# Patient Record
Sex: Female | Born: 1940 | Race: White | Hispanic: No | Smoking: Former smoker
Health system: Southern US, Community
[De-identification: ages and names within clinical notes are randomized; demographics above are authoritative.]

## PROBLEM LIST (undated history)

## (undated) DIAGNOSIS — E785 Hyperlipidemia, unspecified: Secondary | ICD-10-CM

## (undated) DIAGNOSIS — K769 Liver disease, unspecified: Secondary | ICD-10-CM

## (undated) DIAGNOSIS — I2510 Atherosclerotic heart disease of native coronary artery without angina pectoris: Secondary | ICD-10-CM

## (undated) DIAGNOSIS — I5030 Unspecified diastolic (congestive) heart failure: Secondary | ICD-10-CM

## (undated) DIAGNOSIS — G4700 Insomnia, unspecified: Secondary | ICD-10-CM

## (undated) DIAGNOSIS — E119 Type 2 diabetes mellitus without complications: Secondary | ICD-10-CM

## (undated) DIAGNOSIS — I10 Essential (primary) hypertension: Secondary | ICD-10-CM

## (undated) DIAGNOSIS — F410 Panic disorder [episodic paroxysmal anxiety] without agoraphobia: Secondary | ICD-10-CM

## (undated) DIAGNOSIS — K219 Gastro-esophageal reflux disease without esophagitis: Secondary | ICD-10-CM

## (undated) HISTORY — DX: Gastro-esophageal reflux disease without esophagitis: K21.9

## (undated) HISTORY — PX: ABDOMINAL HYSTERECTOMY: SHX81

## (undated) HISTORY — DX: Hyperlipidemia, unspecified: E78.5

## (undated) HISTORY — PX: CYSTECTOMY: SUR359

## (undated) HISTORY — PX: APPENDECTOMY: SHX54

## (undated) HISTORY — DX: Panic disorder (episodic paroxysmal anxiety): F41.0

## (undated) HISTORY — DX: Liver disease, unspecified: K76.9

## (undated) HISTORY — DX: Insomnia, unspecified: G47.00

## (undated) HISTORY — DX: Atherosclerotic heart disease of native coronary artery without angina pectoris: I25.10

---

## 2004-09-11 ENCOUNTER — Emergency Department: Payer: Self-pay | Admitting: Internal Medicine

## 2004-09-11 ENCOUNTER — Other Ambulatory Visit: Payer: Self-pay

## 2004-09-15 ENCOUNTER — Ambulatory Visit: Payer: Self-pay | Admitting: Family Medicine

## 2004-09-24 ENCOUNTER — Ambulatory Visit: Payer: Self-pay | Admitting: Family Medicine

## 2005-03-09 ENCOUNTER — Ambulatory Visit: Payer: Self-pay | Admitting: Family Medicine

## 2006-12-11 ENCOUNTER — Inpatient Hospital Stay: Payer: Self-pay | Admitting: Cardiology

## 2006-12-11 ENCOUNTER — Other Ambulatory Visit: Payer: Self-pay

## 2010-05-27 ENCOUNTER — Ambulatory Visit: Payer: Self-pay

## 2010-05-28 ENCOUNTER — Ambulatory Visit: Payer: Self-pay | Admitting: Cardiology

## 2010-05-30 ENCOUNTER — Emergency Department: Payer: Self-pay | Admitting: Internal Medicine

## 2010-06-12 ENCOUNTER — Emergency Department: Payer: Self-pay | Admitting: Emergency Medicine

## 2010-10-15 ENCOUNTER — Ambulatory Visit: Payer: Self-pay | Admitting: Family Medicine

## 2011-04-15 ENCOUNTER — Ambulatory Visit: Payer: Self-pay | Admitting: Family Medicine

## 2011-04-18 DIAGNOSIS — D509 Iron deficiency anemia, unspecified: Secondary | ICD-10-CM | POA: Insufficient documentation

## 2011-04-19 ENCOUNTER — Ambulatory Visit: Payer: Self-pay | Admitting: Family Medicine

## 2011-08-19 ENCOUNTER — Ambulatory Visit: Payer: Self-pay | Admitting: Family Medicine

## 2013-05-08 LAB — HEMOGLOBIN A1C, FINGERSTICK
ESTIMATED AVERAGE GLUCOSE: 108
Hemoglobin A1C: 5.4

## 2013-09-19 ENCOUNTER — Inpatient Hospital Stay: Payer: Self-pay | Admitting: Internal Medicine

## 2013-09-19 LAB — CBC
HCT: 35.2 % (ref 35.0–47.0)
HGB: 11.2 g/dL — ABNORMAL LOW (ref 12.0–16.0)
MCH: 23.7 pg — ABNORMAL LOW (ref 26.0–34.0)
MCHC: 31.8 g/dL — AB (ref 32.0–36.0)
MCV: 74 fL — ABNORMAL LOW (ref 80–100)
PLATELETS: 154 10*3/uL (ref 150–440)
RBC: 4.73 10*6/uL (ref 3.80–5.20)
RDW: 17.6 % — ABNORMAL HIGH (ref 11.5–14.5)
WBC: 9.5 10*3/uL (ref 3.6–11.0)

## 2013-09-19 LAB — PROTIME-INR
INR: 1.4
PROTHROMBIN TIME: 16.8 s — AB (ref 11.5–14.7)

## 2013-09-19 LAB — COMPREHENSIVE METABOLIC PANEL
ALT: 14 U/L
Albumin: 2.8 g/dL — ABNORMAL LOW (ref 3.4–5.0)
Alkaline Phosphatase: 162 U/L — ABNORMAL HIGH
Anion Gap: 13 (ref 7–16)
BUN: 9 mg/dL (ref 7–18)
Bilirubin,Total: 1.4 mg/dL — ABNORMAL HIGH (ref 0.2–1.0)
CALCIUM: 8.5 mg/dL (ref 8.5–10.1)
CHLORIDE: 105 mmol/L (ref 98–107)
Co2: 21 mmol/L (ref 21–32)
Creatinine: 1.18 mg/dL (ref 0.60–1.30)
EGFR (African American): 53 — ABNORMAL LOW
EGFR (Non-African Amer.): 46 — ABNORMAL LOW
Glucose: 146 mg/dL — ABNORMAL HIGH (ref 65–99)
OSMOLALITY: 279 (ref 275–301)
Potassium: 3.7 mmol/L (ref 3.5–5.1)
SGOT(AST): 31 U/L (ref 15–37)
Sodium: 139 mmol/L (ref 136–145)
Total Protein: 7.7 g/dL (ref 6.4–8.2)

## 2013-09-19 LAB — CBC WITH DIFFERENTIAL/PLATELET
Basophil #: 0 10*3/uL (ref 0.0–0.1)
Basophil %: 0.4 %
EOS ABS: 0.1 10*3/uL (ref 0.0–0.7)
Eosinophil %: 0.6 %
HCT: 34.5 % — ABNORMAL LOW (ref 35.0–47.0)
HGB: 10.8 g/dL — ABNORMAL LOW (ref 12.0–16.0)
LYMPHS ABS: 2.2 10*3/uL (ref 1.0–3.6)
LYMPHS PCT: 19.8 %
MCH: 23.4 pg — AB (ref 26.0–34.0)
MCHC: 31.5 g/dL — ABNORMAL LOW (ref 32.0–36.0)
MCV: 74 fL — ABNORMAL LOW (ref 80–100)
Monocyte #: 0.5 x10 3/mm (ref 0.2–0.9)
Monocyte %: 4.5 %
NEUTROS ABS: 8.1 10*3/uL — AB (ref 1.4–6.5)
Neutrophil %: 74.7 %
PLATELETS: 158 10*3/uL (ref 150–440)
RBC: 4.64 10*6/uL (ref 3.80–5.20)
RDW: 17.7 % — AB (ref 11.5–14.5)
WBC: 10.8 10*3/uL (ref 3.6–11.0)

## 2013-09-19 LAB — HEMOGLOBIN: HGB: 10.3 g/dL — AB (ref 12.0–16.0)

## 2013-09-19 LAB — APTT: Activated PTT: 36 secs — ABNORMAL HIGH (ref 23.6–35.9)

## 2013-09-20 LAB — AMMONIA: AMMONIA, PLASMA: 90 umol/L — AB (ref 11–32)

## 2013-09-20 LAB — CBC WITH DIFFERENTIAL/PLATELET
Basophil #: 0 10*3/uL (ref 0.0–0.1)
Basophil %: 0.5 %
EOS ABS: 0.1 10*3/uL (ref 0.0–0.7)
Eosinophil %: 1.5 %
HCT: 31.4 % — AB (ref 35.0–47.0)
HGB: 10.3 g/dL — ABNORMAL LOW (ref 12.0–16.0)
LYMPHS PCT: 30.4 %
Lymphocyte #: 2.2 10*3/uL (ref 1.0–3.6)
MCH: 24.1 pg — AB (ref 26.0–34.0)
MCHC: 32.9 g/dL (ref 32.0–36.0)
MCV: 73 fL — AB (ref 80–100)
Monocyte #: 0.5 x10 3/mm (ref 0.2–0.9)
Monocyte %: 7.4 %
NEUTROS PCT: 60.2 %
Neutrophil #: 4.3 10*3/uL (ref 1.4–6.5)
Platelet: 130 10*3/uL — ABNORMAL LOW (ref 150–440)
RBC: 4.29 10*6/uL (ref 3.80–5.20)
RDW: 17.5 % — ABNORMAL HIGH (ref 11.5–14.5)
WBC: 7.2 10*3/uL (ref 3.6–11.0)

## 2013-09-20 LAB — HEMOGLOBIN: HGB: 10 g/dL — AB (ref 12.0–16.0)

## 2013-09-23 LAB — PATHOLOGY REPORT

## 2013-09-25 LAB — BASIC METABOLIC PANEL
BUN: 5 mg/dL (ref 4–21)
Creatinine: 0.8 mg/dL (ref 0.5–1.1)
Glucose: 164 mg/dL
POTASSIUM: 3.7 mmol/L (ref 3.4–5.3)
Sodium: 140 mmol/L (ref 137–147)

## 2013-09-25 LAB — CBC AND DIFFERENTIAL
HCT: 34 % — AB (ref 36–46)
Hemoglobin: 10.8 g/dL — AB (ref 12.0–16.0)
Platelets: 165 10*3/uL (ref 150–399)
WBC: 6.1 10^3/mL

## 2013-09-25 LAB — HEPATIC FUNCTION PANEL
ALT: 12 U/L (ref 7–35)
AST: 26 U/L (ref 13–35)

## 2013-10-03 ENCOUNTER — Ambulatory Visit: Payer: Self-pay | Admitting: Family Medicine

## 2013-10-09 ENCOUNTER — Ambulatory Visit: Payer: Self-pay | Admitting: Family Medicine

## 2013-10-17 ENCOUNTER — Ambulatory Visit: Payer: Self-pay | Admitting: Gastroenterology

## 2013-10-29 ENCOUNTER — Ambulatory Visit: Payer: Self-pay | Admitting: Family Medicine

## 2013-12-26 ENCOUNTER — Ambulatory Visit: Payer: Self-pay | Admitting: Family Medicine

## 2014-01-14 LAB — GLUCOSE (CC13): Glucose: 164

## 2014-05-17 NOTE — Consult Note (Signed)
PATIENT NAME:  Katie Travis, MOFFET MR#:  [id number] DATE OF BIRTH:  [birth year]  DATE OF CONSULTATION:  09/20/2013  REFERRING PHYSICIAN:   CONSULTING PHYSICIAN:  Manya Silvas, MD  HISTORY OF PRESENT ILLNESS: The patient is originally from [place] She came to the [place] in 1963. 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 She says that she drinks a lot of beer and makes beer at home. She drinks maybe 10 beers a day.   The patient had onset of hematemesis, woke up about 2:00 a.m., went to the bathroom and vomited dark red blood. The blood was in the very first vomitus. She vomited twice more and was brought to the Emergency Room where she was admitted. She was noted to be hemodynamically stable in the ER and I was consulted for upper GI bleeding.   PAST MEDICAL HISTORY: 1.  Cardiac stents for coronary artery disease.  2.  Diabetes.  3.  Hypertension.  4.  Anxiety.   ALLERGIES: No known drug allergies.   MEDICATIONS: Toprol-XL 25 mg a day, Plavix 75 mg daily, NitroQuick 0.4 mg daily, nifedipine 30 mg a day, metformin 1000 mg a day, lovastatin 20 mg a day, lorazepam 1 mg b.i.d., Imdur 30 mg a day, glipizide 2.5 mg daily and aspirin 81 mg a day.   FAMILY HISTORY: Positive for hypertension and diabetes.   HABITS: Does not smoke. Does drink beer.   REVIEW OF SYSTEMS: No asthma or wheezing. No chest pains. No skipping irregular heartbeats. No abdominal pain at this time. No dysuria or hematuria.   PHYSICAL EXAMINATION: GENERAL: A very pleasant, somewhat plump European Caucasian. VITAL SIGNS: Temperature 98.8, pulse 108, respirations 18, blood pressure 132/63.  HEENT: Sclerae anicteric. Conjunctivae negative. The head is atraumatic. Tongue is negative.  CHEST: Clear.  HEART: Regular rate and rhythm.  ABDOMEN: No hepatosplenomegaly. No masses. No bruits. Nontender.  EXTREMITIES: Trace edema.  NEUROLOGIC: The patient is awake, alert  and oriented.  PSYCHIATRIC: Mood and affect are normal.  SKIN: She does have spider angiomas on her skin on her chest.   DIAGNOSTIC DATA: Glucose 146, BUN 9, creatinine 1.18, sodium 139, potassium 3.7, chloride 105, CO2 21, calcium 8.5, total protein 7.7, albumin 2.8, total bilirubin 1.4, alk phos 162, SGOT 31, SGPT 14. White blood count 7.2, hemoglobin 10.3, platelet count on admission 158,000, this morning is 130,000, MCV 73, MCH 24.1. PT 16.8 and PTT 36.   ASSESSMENT: Upper gastrointestinal bleed in a patient who has been a heavy beer drinker who has spider angiomas on her chest. Her initial platelet count was low normal and has dropped overnight. She also has hypochromic microcytic indices and may have some degree of iron deficiency anemia. Since she is an alcohol drinker and has spider angiomas on her skin, she likely does have cirrhosis to a degree and therefore portal hypertension which could produce esophageal varices or portal hypertensive gastropathy or she could have alcohol related ulcers, gastritis, duodenitis. She also has hypochromic microcytic anemia and may well be iron deficient. Her albumin is a little low at 2.8, total bilirubin 1.4, a little elevated alkaline phosphatase of 162.   PLAN: Make the patient n.p.o. Do upper endoscopy later today. Possibly could need banding if she does have varices and there is any evidence of recent bleeding.    ____________________________ Manya Silvas, MD rte:sb D: 09/20/2013 07:02:47 ET T: 09/20/2013 07:26:32 ET JOB#: [id number]  cc: Manya Silvas,  MD, <Dictator> Manya Silvas MD ELECTRONICALLY SIGNED 09/22/2013 12:54

## 2014-05-17 NOTE — Discharge Summary (Signed)
PATIENT NAME:  Katie Travis, Katie Travis MR#:  [id number] DATE OF BIRTH:  06/21/1940  DATE OF ADMISSION:  09/19/2013 DATE OF DISCHARGE:    ADMITTING DIAGNOSIS: Hematemesis.   DISCHARGE DIAGNOSES:  1.  Hematemesis due to antral gastritis, status post esophagogastroduodenoscopy on the 28th of August 2015 by Dr. Bluford Kaufmannh.  Grade 1 esophageal varices noted, mild portal hypertensive gastropathy and antral gastritis, which was likely the source of bleeding according gastroenterology.  2.  Acute posthemorrhagic anemia.  3.  Suspected liver cirrhosis, questionable alcohol abuse related. 4.  Altered mental status. 5.  Hepatic encephalopathy.  6.  History of anxiety. 7.  Hypertension. 8.  Hyperlipidemia. 9.  Coronary artery disease. 10. Diabetes.     DISCHARGE CONDITION: Stable.   DISCHARGE MEDICATIONS: The patient is to continue lovastatin 10 mg p.o. daily, nifedipine 30 mg p.o. daily, lorazepam 1 mg p.o. twice daily, NitroQuick 0.4 mg once daily, metformin 1 gram once daily, glipizide 2.5 mg daily, nadolol 20 mg p.o. daily (this is a new medication), omeprazole 40 mg p.o. twice daily, and lactulose 30 mL twice daily.   The patient is not to take aspirin, Toprol, Imdur, or Plavix until it is recommended by primary care physician.   Home oxygen: None.   DIET: 2 gram salt, low-fat, low-cholesterol, carbohydrate-controlled diet, mechanical soft. To be advanced to a regular consistency as tolerated.   FOLLOWUP:  Followup appointment with Dr. Sherrie MustacheFisher in two days after discharge and with  Dr. Bluford Kaufmannh one week after discharge.   The patient was recommended to have an ultrasound of the liver as an outpatient for evaluation of possible liver cirrhosis.   CONSULTANTS: Care management, social work, Dr. Mechele CollinElliott, as well as Dr. Bluford Kaufmannh.   RADIOLOGIC STUDIES:  None.   HISTORY AND HOSPITAL COURSE:  The patient is A 74 year old Caucasian female with a past medical history significant for history of multiple medical problems  including hypertension, hyperlipidemia, and diabetes mellitus, who presents to the hospital with complaints of vomiting bright red blood.  Please refer to Dr. Quintella ReichertWilkerson's admission note on the 09/19/2013.  Her hemoglobin level in the Emergency Room was noted to be 10.8.  She was Hemoccult positive.  She was started on a Protonix IV drip and octreotide, later was initiated.  She was afebrile. Her pulse was 104, blood pressure 135/59, and oxygen saturations were 94% on room air.   Physical exam was unremarkable except for mild lower extremity edema. The patient's lab data in the Emergency Room on 09/19/2013 revealed elevated glucose level to 146; otherwise BMP was unremarkable. The patient's liver enzymes revealed albumin level of 2.8, total bilirubin of 1.4, alkaline phosphatase 162, otherwise liver enzymes were unremarkable. The patient's white blood cell count was normal at 10.8, hemoglobin was 10.8, and platelet count 158.  Absolute neutrophil count was 8.1. Coagulation panel revealed a prothrombin time of 16.8, INR 1.4 with an activated PTT of 36.0.   The patient was admitted to the hospital for further evaluation.  Consultation with a gastroenterologist was obtained.  Gastroenterologist saw the patient in consultation on 09/20/2013.  He felt that patient makes homegrown beer and drinks a lot. She has spider angioma on the chest and because of concerns of possible varices as well as possible hypertensive gastropathy, EGD was performed by Dr. Bluford Kaufmannh.  EGD revealed grade 1 esophageal varices, mild portal hypertensive gastropathy.  Erythematous mucosa of the antrum was noted which was felt to be a possible source of bleeding, which was biopsied and the patient was  returned back to the floor and a regular diet was ordered by Dr. Bluford Kaufmann. If the patient is able to tolerate this diet and hemoglobin remains stable, she will likely be discharged home today on 09/20/2013.  She was advised to continue proton pump inhibitors.  She was also given nadolol to decrease portal hypertension.  She was recommended to hold her aspirin as well as Plavix unless recommended by her primary care physician.   The patient was noted to be confused while in the hospital. She had an ammonia level checked, which was found to be elevated at 90. It was felt that patient had hepatic encephalopathy and lactulose was initiated.  Per the gastroenterologist, Dr. Bluford Kaufmann, the patient was advised to get a liver ultrasound to evaluate for liver cirrhosis.  On the day of discharge, 09/20/2013, the patient's vital signs:  Temperature 98.3, pulse of 74, respiratory rate 18, blood pressure 112/65, saturation 92%-100% on room air at rest. If the patient's hemoglobin level remains stable which it was since admission, the patient will likely be discharged home today. This was discussed with the patient's son who voiced agreement.   TIME SPENT: 40 minutes    ____________________________ Katharina Caper, MD rv:nr D: 09/20/2013 18:13:00 ET T: 09/20/2013 19:31:53 ET JOB#: [id number]  cc: Katharina Caper, MD, <Dictator> Attie Nawabi MD ELECTRONICALLY SIGNED 10/12/2013 18:44

## 2014-05-17 NOTE — Consult Note (Signed)
EGD performed for Dr. Mechele CollinElliott. No active bleeding. Grade 1 esophageal varices and mild portal gastropathy. No bleeding from these sites. However, patient does have gastritis in antrum, which appear to be the site of bleeding. This could be early GAVE. Biopsies taken. ADA diet ordered. Would get liver u/s to evaluate for liver cirrhosis. Would start either inderal or nadolol to lower portal pressures. Add this to imdur or replace imdur. Continue to hold ASA/plavix for now. Will see patient over the weekend. Thanks.   Electronic Signatures: Lutricia Feilh, Jeyla Bulger (MD) (Signed on 28-Aug-15 11:44)  Authored   Last Updated: 28-Aug-15 11:45 by Lutricia Feilh, Kamesha Herne (MD)

## 2014-05-17 NOTE — H&P (Signed)
PATIENT NAME:  Katie Travis, [name] MR#:  [id number] DATE OF BIRTH:  1940-07-17  DATE OF ADMISSION:  09/19/2013  PRIMARY CARE PHYSICIAN: Dr. Sherrie MustacheFisher.  CHIEF COMPLAINT: Vomiting bright red blood today.   HISTORY OF PRESENT ILLNESS: Katie Travis is a 74 year old Caucasian female with history of anxiety, hypertension, diabetes, and history of CAD status post stent in the past who comes with her husband to the Emergency Room after she had 3 episodes of bright to dark red vomiting at home, a moderate amount in the middle of the night around 2:00 in the morning. The patient said she ate some baked potato, some chicken noodle soup, some crackers, and an apple, went to bed, woke up around 2:00 not feeling well, had a "queazy" stomach, went to the bathroom and vomited blood. Brought her to the Emergency Room, she was hemodynamically stable. No vomiting since. She is Hemoccult-positive. She is being admitted for further evaluation and management. Her hemoglobin is 10.8. She is going to be started on IV Protonix drip.   PAST MEDICAL HISTORY:  1.  Anxiety.  2.  Hypertension.  3.  Diabetes.  4.  Cardiac stents, with history of coronary artery disease.   ALLERGIES: No known drug allergies.   MEDICATIONS:  1.  Toprol XL 25 mg daily.  2.  Plavix 75 mg daily.  3.  NitroQuick 0.4 mg daily.  4.  Nifedipine 30 mg daily.  5.  Metformin 1000 mg daily.  6.  Lovastatin 20 mg daily.  7.  Lorazepam 1 mg b.i.d.   8.  Imdur 30 mg daily.  9.  Glipizide 2.5 mg daily.  10.   Aspirin 81 mg p.o. daily.   FAMILY HISTORY: Positive for hypertension, diabetes.   SOCIAL HISTORY: Lives at home. Nonsmoker, nonalcoholic.   REVIEW OF SYSTEMS: CONSTITUTIONAL: No fever, fatigue, weakness.  EYES: No blurred or double vision, cataracts or glaucoma.  ENT: No tinnitus, ear pain, hearing loss or postnasal drip.  RESPIRATORY: No cough, wheeze, hemoptysis, dyspnea, COPD. CARDIOVASCULAR: No chest pain, orthopnea, edema, or hypertension.   GASTROINTESTINAL: Positive for hematemesis and melena. No nausea or diarrhea. No GERD.  GENITOURINARY: No dysuria, hematuria, or frequency.  ENDOCRINE: No poly nocturia or thyroid problems.  HEMATOLOGY: Positive for chronic anemia. No easy bruising or bleeding.  SKIN: No acne, rash, or lesions.  MUSCULOSKELETAL: Positive for arthritis. No swelling or gout.  NEUROLOGIC: No CVA, TIA, dysarthria, or epilepsy.  PSYCHIATRIC: No anxiety, depression, or bipolar disorder.  All other systems reviewed and negative.   PHYSICAL EXAMINATION:  GENERAL: The patient is awake, alert, oriented x 3, not in acute distress.  VITAL SIGNS: Afebrile. Pulse is 104, blood pressure is 135/59, saturating 94% on room air.  HEENT: Atraumatic, normocephalic. Pupils PERLA, EOMI intact. Oral mucosa is moist.  NECK: Supple. No JVD. No carotid bruit.  RESPIRATORY: Clear to auscultation bilaterally. No rales, rhonchi, respiratory distress, or labored breathing.  CARDIOVASCULAR: Both the heart sounds are normal. Rate, rhythm regular. PMI not lateralized. Chest nontender.   ABDOMEN: Soft, benign, nontender. No organomegaly. Positive bowel sounds. The patient is Hemoccult positive.   EXTREMITIES: Good pedal pulses. Good femoral pulses. The patient has 1+ pitting edema bilaterally.   NEUROLOGIC: Grossly intact Cranial nerves II through XII. No motor or sensory deficit.  PSYCHIATRIC: The patient is awake, alert, oriented x 3. Mood and  affect normal.  SKIN: Warm and dry.   LABORATORY DATA: Hemoglobin and hematocrit is 10.8 and 34.5, platelet count is 158,000, white count is  10.8. Glucose is 146, BUN is 9, creatinine is 1.18, sodium is 139, potassium 3.7. Her alkaline phosphatase is 162, SGPT 14.   ASSESSMENT: A 74 year old, Katie Travis, with history of hypertension, diabetes, and coronary artery disease, comes in with:  1.  Acute upper gastrointestinal bleed. The patient vomited 3 times bright to dark red blood at home. In the  differential is Mallory-Weiss tear versus peptic ulcer disease. Will admit the patient to medical floor with off unit telemetry. N.p.o. for now, IV fluids, and monitor hemoglobin and hematocrit closely, and continue Protonix drip. We will monitor hemoglobin closely and transfuse as needed. Discussed with Dr. Mechele Collin, recommends treatment as above.  2.  Type 2 diabetes. Continue sliding scale insulin. The patient is n.p.o. Will hold p.o. meds.  3.  Hypertension. The patient's blood pressure is stable. I will hold off on p.o. meds right now currently.  4.  Deep vein thrombosis prophylaxis. Will do SCDs and TEDs. No antiplatelets in the setting of a GI bleed.  5.  Anxiety, p.r.n. lorazepam. Will give IV push lorazepam 0.5 mg b.i.d. p.r.n. as needed for anxiety.   The above was discussed with the patient and patient's family members, agreeable to it.   TIME SPENT: 50 minutes.    ____________________________ Wylie Hail Allena Katz, MD mge:at D: 09/19/2013 17:57:22 ET T: 09/19/2013 18:14:33 ET JOB#: [id number]  cc: Demetrios Isaacs. Sherrie Mustache, MD Willow Ora MD ELECTRONICALLY SIGNED 10/03/2013 14:01

## 2014-05-17 NOTE — Consult Note (Signed)
See dictated note.  Pt from [place].  Makes her home grown beer and drinks a "lot".  She has spider angioma on chest, had hematemesis 3 times and could have varices, portal hypertensive gastropathy, or ulcers.  Plan EGD today.  Depending on schedule may have partner do this.  Electronic Signatures: Scot JunElliott, Ilene Witcher T (MD)  (Signed on 28-Aug-15 06:54)  Authored  Last Updated: 28-Aug-15 06:54 by Scot JunElliott, Misheel Gowans T (MD)

## 2014-07-06 ENCOUNTER — Emergency Department: Payer: Medicare Other

## 2014-07-06 ENCOUNTER — Inpatient Hospital Stay
Admission: EM | Admit: 2014-07-06 | Discharge: 2014-07-09 | DRG: 689 | Disposition: A | Discharge status: Home Health | Payer: Medicare Other | Attending: Internal Medicine | Admitting: Internal Medicine

## 2014-07-06 ENCOUNTER — Encounter: Payer: Self-pay | Admitting: Emergency Medicine

## 2014-07-06 DIAGNOSIS — I248 Other forms of acute ischemic heart disease: Secondary | ICD-10-CM | POA: Diagnosis present

## 2014-07-06 DIAGNOSIS — R4182 Altered mental status, unspecified: Secondary | ICD-10-CM | POA: Diagnosis not present

## 2014-07-06 DIAGNOSIS — I129 Hypertensive chronic kidney disease with stage 1 through stage 4 chronic kidney disease, or unspecified chronic kidney disease: Secondary | ICD-10-CM | POA: Diagnosis present

## 2014-07-06 DIAGNOSIS — R0902 Hypoxemia: Secondary | ICD-10-CM

## 2014-07-06 DIAGNOSIS — E162 Hypoglycemia, unspecified: Secondary | ICD-10-CM

## 2014-07-06 DIAGNOSIS — Z66 Do not resuscitate: Secondary | ICD-10-CM | POA: Diagnosis present

## 2014-07-06 DIAGNOSIS — I2510 Atherosclerotic heart disease of native coronary artery without angina pectoris: Secondary | ICD-10-CM

## 2014-07-06 DIAGNOSIS — N184 Chronic kidney disease, stage 4 (severe): Secondary | ICD-10-CM | POA: Diagnosis not present

## 2014-07-06 DIAGNOSIS — Z9114 Patient's other noncompliance with medication regimen: Secondary | ICD-10-CM | POA: Diagnosis present

## 2014-07-06 DIAGNOSIS — E11649 Type 2 diabetes mellitus with hypoglycemia without coma: Secondary | ICD-10-CM | POA: Diagnosis not present

## 2014-07-06 DIAGNOSIS — Z955 Presence of coronary angioplasty implant and graft: Secondary | ICD-10-CM

## 2014-07-06 DIAGNOSIS — F329 Major depressive disorder, single episode, unspecified: Secondary | ICD-10-CM | POA: Diagnosis present

## 2014-07-06 DIAGNOSIS — B9620 Unspecified Escherichia coli [E. coli] as the cause of diseases classified elsewhere: Secondary | ICD-10-CM | POA: Diagnosis not present

## 2014-07-06 DIAGNOSIS — Z87891 Personal history of nicotine dependence: Secondary | ICD-10-CM | POA: Diagnosis not present

## 2014-07-06 DIAGNOSIS — Z23 Encounter for immunization: Secondary | ICD-10-CM

## 2014-07-06 DIAGNOSIS — A419 Sepsis, unspecified organism: Secondary | ICD-10-CM | POA: Diagnosis not present

## 2014-07-06 DIAGNOSIS — R791 Abnormal coagulation profile: Secondary | ICD-10-CM

## 2014-07-06 DIAGNOSIS — N3000 Acute cystitis without hematuria: Principal | ICD-10-CM | POA: Diagnosis present

## 2014-07-06 DIAGNOSIS — F0150 Vascular dementia without behavioral disturbance: Secondary | ICD-10-CM | POA: Diagnosis present

## 2014-07-06 DIAGNOSIS — N170 Acute kidney failure with tubular necrosis: Secondary | ICD-10-CM | POA: Diagnosis not present

## 2014-07-06 DIAGNOSIS — D631 Anemia in chronic kidney disease: Secondary | ICD-10-CM | POA: Diagnosis present

## 2014-07-06 DIAGNOSIS — I5033 Acute on chronic diastolic (congestive) heart failure: Secondary | ICD-10-CM | POA: Diagnosis not present

## 2014-07-06 DIAGNOSIS — E1122 Type 2 diabetes mellitus with diabetic chronic kidney disease: Secondary | ICD-10-CM | POA: Diagnosis present

## 2014-07-06 DIAGNOSIS — R0602 Shortness of breath: Secondary | ICD-10-CM

## 2014-07-06 DIAGNOSIS — R410 Disorientation, unspecified: Secondary | ICD-10-CM

## 2014-07-06 DIAGNOSIS — I509 Heart failure, unspecified: Secondary | ICD-10-CM | POA: Diagnosis not present

## 2014-07-06 DIAGNOSIS — R6510 Systemic inflammatory response syndrome (SIRS) of non-infectious origin without acute organ dysfunction: Secondary | ICD-10-CM

## 2014-07-06 DIAGNOSIS — F0390 Unspecified dementia without behavioral disturbance: Secondary | ICD-10-CM

## 2014-07-06 HISTORY — DX: Atherosclerotic heart disease of native coronary artery without angina pectoris: I25.10

## 2014-07-06 HISTORY — DX: Unspecified diastolic (congestive) heart failure: I50.30

## 2014-07-06 HISTORY — DX: Type 2 diabetes mellitus without complications: E11.9

## 2014-07-06 HISTORY — DX: Essential (primary) hypertension: I10

## 2014-07-06 LAB — COMPREHENSIVE METABOLIC PANEL
ALBUMIN: 2.1 g/dL — AB (ref 3.5–5.0)
ALT: 17 U/L (ref 14–54)
ANION GAP: 7 (ref 5–15)
AST: 43 U/L — AB (ref 15–41)
Alkaline Phosphatase: 124 U/L (ref 38–126)
BILIRUBIN TOTAL: 2.9 mg/dL — AB (ref 0.3–1.2)
BUN: 31 mg/dL — ABNORMAL HIGH (ref 6–20)
CO2: 24 mmol/L (ref 22–32)
CREATININE: 2.48 mg/dL — AB (ref 0.44–1.00)
Calcium: 8.1 mg/dL — ABNORMAL LOW (ref 8.9–10.3)
Chloride: 105 mmol/L (ref 101–111)
GFR calc non Af Amer: 18 mL/min — ABNORMAL LOW (ref >60)
GFR, EST AFRICAN AMERICAN: 21 mL/min — AB (ref >60)
Glucose, Bld: 76 mg/dL (ref 65–99)
POTASSIUM: 5.3 mmol/L — AB (ref 3.5–5.1)
Sodium: 136 mmol/L (ref 135–145)
Total Protein: 6.7 g/dL (ref 6.5–8.1)

## 2014-07-06 LAB — CBC WITH DIFFERENTIAL/PLATELET
Basophils Absolute: 0 10*3/uL (ref 0–0.1)
Basophils Relative: 0 %
Eosinophils Absolute: 0 10*3/uL (ref 0–0.7)
Eosinophils Relative: 0 %
HEMATOCRIT: 29.8 % — AB (ref 35.0–47.0)
Hemoglobin: 9.4 g/dL — ABNORMAL LOW (ref 12.0–16.0)
LYMPHS PCT: 15 %
Lymphs Abs: 1.6 10*3/uL (ref 1.0–3.6)
MCH: 24 pg — AB (ref 26.0–34.0)
MCHC: 31.6 g/dL — ABNORMAL LOW (ref 32.0–36.0)
MCV: 75.9 fL — ABNORMAL LOW (ref 80.0–100.0)
Monocytes Absolute: 0.5 10*3/uL (ref 0.2–0.9)
Monocytes Relative: 4 %
NEUTROS PCT: 81 %
Neutro Abs: 8.9 10*3/uL — ABNORMAL HIGH (ref 1.4–6.5)
PLATELETS: 199 10*3/uL (ref 150–440)
RBC: 3.92 MIL/uL (ref 3.80–5.20)
RDW: 24.6 % — AB (ref 11.5–14.5)
WBC: 11 10*3/uL (ref 3.6–11.0)

## 2014-07-06 LAB — GLUCOSE, CAPILLARY
GLUCOSE-CAPILLARY: 70 mg/dL (ref 65–99)
GLUCOSE-CAPILLARY: 80 mg/dL (ref 65–99)
GLUCOSE-CAPILLARY: 102 mg/dL — AB (ref 65–99)
Glucose-Capillary: 130 mg/dL — ABNORMAL HIGH (ref 65–99)

## 2014-07-06 LAB — URINALYSIS COMPLETE WITH MICROSCOPIC (ARMC ONLY)
Bilirubin Urine: NEGATIVE
GLUCOSE, UA: NEGATIVE mg/dL
Hgb urine dipstick: NEGATIVE
Ketones, ur: NEGATIVE mg/dL
Nitrite: NEGATIVE
Protein, ur: 30 mg/dL — AB
Specific Gravity, Urine: 1.015 (ref 1.005–1.030)
pH: 5 (ref 5.0–8.0)

## 2014-07-06 LAB — BLOOD GAS, VENOUS
Acid-Base Excess: 1 mmol/L (ref 0.0–3.0)
BICARBONATE: 25.2 meq/L (ref 21.0–28.0)
Patient temperature: 37
pCO2, Ven: 38 mmHg — ABNORMAL LOW (ref 44.0–60.0)
pH, Ven: 7.43 (ref 7.320–7.430)

## 2014-07-06 LAB — TROPONIN I: Troponin I: <0.03 ng/mL (ref <0.031)

## 2014-07-06 LAB — PROTIME-INR
INR: 1.7
PROTHROMBIN TIME: 20.2 s — AB (ref 11.4–15.0)

## 2014-07-06 MED ORDER — BISACODYL 10 MG RE SUPP: 10.0000 mg | Freq: Every day | RECTAL | Status: DC | PRN

## 2014-07-06 MED ORDER — ONDANSETRON HCL 4 MG PO TABS: 4.0000 mg | ORAL_TABLET | Freq: Four times a day (QID) | ORAL | Status: DC | PRN

## 2014-07-06 MED ORDER — INSULIN ASPART 100 UNIT/ML ~~LOC~~ SOLN: 0.0000 [IU] | SUBCUTANEOUS | Status: DC

## 2014-07-06 MED ORDER — DEXTROSE 50 % IV SOLN
INTRAVENOUS | Status: AC | PRN
  Administered 2014-07-06: 1 via INTRAVENOUS

## 2014-07-06 MED ORDER — NALOXONE HCL 1 MG/ML IJ SOLN
INTRAMUSCULAR | Status: AC
  Filled 2014-07-06: qty 2

## 2014-07-06 MED ORDER — DEXTROSE-NACL 5-0.9 % IV SOLN
INTRAVENOUS | Status: DC
  Administered 2014-07-06: 19:00:00 via INTRAVENOUS

## 2014-07-06 MED ORDER — CEFTRIAXONE SODIUM IN DEXTROSE 20 MG/ML IV SOLN
INTRAVENOUS | Status: AC
  Filled 2014-07-06: qty 50

## 2014-07-06 MED ORDER — HEPARIN SODIUM (PORCINE) 5000 UNIT/ML IJ SOLN
5000.0000 [IU] | Freq: Three times a day (TID) | INTRAMUSCULAR | Status: DC
  Administered 2014-07-06 – 2014-07-07 (×2): 5000 [IU] via SUBCUTANEOUS
  Filled 2014-07-06 (×2): qty 1

## 2014-07-06 MED ORDER — DEXTROSE 50 % IV SOLN
INTRAVENOUS | Status: AC
  Administered 2014-07-06: 50 mL
  Filled 2014-07-06: qty 50

## 2014-07-06 MED ORDER — ONDANSETRON HCL 4 MG/2ML IJ SOLN: 4.0000 mg | Freq: Four times a day (QID) | INTRAMUSCULAR | Status: DC | PRN

## 2014-07-06 MED ORDER — IPRATROPIUM-ALBUTEROL 0.5-2.5 (3) MG/3ML IN SOLN
3.0000 mL | Freq: Four times a day (QID) | RESPIRATORY_TRACT | Status: DC
  Administered 2014-07-07 – 2014-07-09 (×7): 3 mL via RESPIRATORY_TRACT
  Filled 2014-07-06 (×10): qty 3

## 2014-07-06 MED ORDER — ASPIRIN EC 81 MG PO TBEC
81.0000 mg | DELAYED_RELEASE_TABLET | Freq: Every day | ORAL | Status: DC
  Administered 2014-07-08 – 2014-07-09 (×2): 81 mg via ORAL
  Filled 2014-07-06 (×3): qty 1

## 2014-07-06 MED ORDER — MORPHINE SULFATE 2 MG/ML IJ SOLN: 2.0000 mg | INTRAMUSCULAR | Status: DC | PRN

## 2014-07-06 MED ORDER — ACETAMINOPHEN 325 MG PO TABS: 650.0000 mg | ORAL_TABLET | Freq: Four times a day (QID) | ORAL | Status: DC | PRN

## 2014-07-06 MED ORDER — CEFTRIAXONE SODIUM IN DEXTROSE 20 MG/ML IV SOLN
1.0000 g | Freq: Once | INTRAVENOUS | Status: AC
  Administered 2014-07-06: 1 g via INTRAVENOUS

## 2014-07-06 MED ORDER — SODIUM CHLORIDE 0.9 % IV BOLUS (SEPSIS)
1000.0000 mL | Freq: Once | INTRAVENOUS | Status: AC
  Administered 2014-07-06: 1000 mL via INTRAVENOUS

## 2014-07-06 MED ORDER — CEFTRIAXONE SODIUM IN DEXTROSE 20 MG/ML IV SOLN
1.0000 g | INTRAVENOUS | Status: DC
  Administered 2014-07-07 – 2014-07-08 (×2): 1 g via INTRAVENOUS
  Filled 2014-07-06 (×4): qty 50

## 2014-07-06 MED ORDER — DEXTROSE 5 % IV SOLN
500.0000 mg | INTRAVENOUS | Status: DC
  Administered 2014-07-06 – 2014-07-07 (×2): 500 mg via INTRAVENOUS
  Filled 2014-07-06 (×2): qty 500

## 2014-07-06 MED ORDER — ACETAMINOPHEN 650 MG RE SUPP
650.0000 mg | Freq: Four times a day (QID) | RECTAL | Status: DC | PRN
  Administered 2014-07-07: 650 mg via RECTAL
  Filled 2014-07-06: qty 1

## 2014-07-06 MED ORDER — NALOXONE HCL 1 MG/ML IJ SOLN
0.8000 mg | Freq: Once | INTRAMUSCULAR | Status: AC
  Administered 2014-07-06: 0.8 mg via INTRAVENOUS

## 2014-07-06 MED ORDER — DOCUSATE SODIUM 100 MG PO CAPS
100.0000 mg | ORAL_CAPSULE | Freq: Two times a day (BID) | ORAL | Status: DC
  Administered 2014-07-08 – 2014-07-09 (×3): 100 mg via ORAL
  Filled 2014-07-06 (×3): qty 1

## 2014-07-06 MED ORDER — FAMOTIDINE IN NACL 20-0.9 MG/50ML-% IV SOLN
20.0000 mg | Freq: Two times a day (BID) | INTRAVENOUS | Status: DC
  Administered 2014-07-06 – 2014-07-08 (×4): 20 mg via INTRAVENOUS
  Filled 2014-07-06 (×6): qty 50

## 2014-07-06 NOTE — ED Provider Notes (Addendum)
[facility] Emergency Department Provider Note  Time seen: 5:47 PM  I have reviewed the triage vital signs and the nursing notes.   HISTORY  Chief Complaint Hypoglycemia    HPI Rozina DEBAR PLATE is a 74 y.o. female with a past medical history of diabetes, hypertension, CHF who presents to the emergency department largely unresponsive. According to paramedic report the patient currently lives at home with 2 of her sons who've been caring for her, a third son arrived today and was very concerned over his mother's condition so he called 911. According to paramedics patient is a had a blood glucose of 20, an IV was started and patient was given glucose which brought the blood glucose up to 110 however patient remained largely unresponsive. Patient will moan, open eyes and look around, she will withdraw all extremities to pain but she will not talk, or follow commands. It is unclear exactly when this behavior started, or other history surrounding her presentation, we are currently awaiting family arrival for further history.Patient does arrive with many medications, most of which appear untouched, full bottles, for instance there are 6 bottles full of glipizide.    Past Medical History  Diagnosis Date  . Coronary artery disease   . Diabetes mellitus without complication   . Hypertension   . Depression     There are no active problems to display for this patient.   History reviewed. No pertinent past surgical history.  No current outpatient prescriptions on file.  Allergies Review of patient's allergies indicates not on file.  History reviewed. No pertinent family history.  Social History History  Substance Use Topics  . Smoking status: Not on file  . Smokeless tobacco: Not on file  . Alcohol Use: Not on file    Review of Systems Unable to obtain a review of systems due to unresponsiveness.  ____________________________________________   PHYSICAL  EXAM:  VITAL SIGNS: ED Triage Vitals  Enc Vitals Group     BP --      Pulse --      Resp --      Temp --      Temp src --      SpO2 --      Weight --      Height --      Head Cir --      Peak Flow --      Pain Score --      Pain Loc --      Pain Edu? --      Excl. in GC? --     Constitutional: Alert, eyes open, moaning, appears to withdraw all extremities to pain, however not following commands or speaking. Eyes: 2 mm, PERRL bilaterally ENT   Head: Normocephalic and atraumatic.   Mouth/Throat: Dry mucous membranes Cardiovascular: Regular rhythm, rate around 80 bpm, no obvious murmur. Respiratory: Mild tachypnea, normal respiratory effort. No wheezes/rales/rhonchi identified. Gastrointestinal: Soft, no reaction to abdominal palpation. Musculoskeletal: Atraumatic appearing extremities, bilateral lower extremity edema. Neurologic:  Patient appears to be moving all extremities at times. she is not following commands, or speaking. Skin:  Skin is warm, dry Psychiatric: Mood and affect are normal. Speech and behavior are normal.   ____________________________________________    EKG  EKG reviewed and interpreted by myself shows what appears to be a junctional rhythm at 80 bpm, borderline wide QRS, normal axis, nonspecific but no concerning ST changes noted.  ____________________________________________    RADIOLOGY  CT head does not show  any acute abnormality.  ____________________________________________    INITIAL IMPRESSION / ASSESSMENT AND PLAN / ED COURSE  Pertinent labs & imaging results that were available during my care of the patient were reviewed by me and considered in my medical decision making (see chart for details).  Patient appears from home largely unresponsive. Currently the patient has a GCS of 10 (opens eyes spontaneously, moans, withdraw to pain). Patient smells strongly of urine. Patient's blood sugar had dropped to 20 upon EMS arrival, had  improved to 110 after glucose was administered, it is now dropped back down to 70. We will start the patient on a D5 normal saline drip. However even when her blood glucose had improved her mental status had not. Given her decreased blood glucose and as the patient is largely unresponsive, differential includes stroke, sepsis. We will proceed with labs including cultures, urinalysis, urine cultures, CT head. Currently awaiting family arrival for further history.   CT head does not show any acute abnormality. Urinary infection evident on urinalysis. We have started the patient on ceftriaxone, urine and blood cultures have been sent. We will admit the patient for further workup and treatment.  Family now at bedside states the patient was acting her baseline yesterday. Which they state is talking, conversing. They state the patient does not ambulate, but there is no reason why she cannot walk she just "gave up on walking." Patient appears to be somewhat more responsive at this time, but just continues to moan, will follow some basic commands now but will not speak. We will admit the patient for her urinary tract infection and confusion. I discussed with the family it appears that the patient has not been taking any medications for several months now because she believes "the doctors are against her" per the son.  Hyperkalemia on labs, no EKG changes evident. I have bolused the patient with IV fluids, we will continue to monitor in the emergency department while awaiting admission to the hospital. ____________________________________________   FINAL CLINICAL IMPRESSION(S) / ED DIAGNOSES  Unresponsiveness Urinary tract infection   Minna Antis, MD 07/06/14 1850  Minna Antis, MD 07/06/14 [phone]

## 2014-07-06 NOTE — ED Notes (Signed)
Family at bedside. 

## 2014-07-06 NOTE — ED Notes (Signed)
Patient to ED via EMS with report of being unresponsive for most of day per family. EMS found patient with blood sugar of 20 and gave 1 amp D50 with little increase in responsiveness. On arrival to ED patient still unresponsive for most part with occasional grunting. BS checked on arrival found to be 80. SL established by EMS in left hand 20 gauge. Dr. Lenard Lance to bedside.

## 2014-07-06 NOTE — ED Notes (Signed)
Pt transported to CT with Lanice Schwab, RN

## 2014-07-06 NOTE — H&P (Signed)
History and Physical    Katie Travis:[id number] DOB: 1940/04/18 DOA: 07/06/2014  Referring physician: Dr. Lenard Lance PCP: Mila Merry, MD  Specialists: Dr. Lady Gary  Chief Complaint: AMS with lethargy  HPI: Katie Travis is a 74 y.o. female has a past medical history significant for ASCVD, HTN, and DM who presents to the ER with lethargy and weakness x 1-2 days. Pt has been non-compliant at home and has not taken any medications in several months. Family states she has not gone to the doctor in almost a year. They are unable to care for patient at home in her present condition. In the ER, the patient was not verbalizing but was in NAD. She was noted to be hypoglycemic with a UTI and is now admitted for further evaluation.  Review of Systems: unable to obtain from patient due to AMS.  Past Medical History  Diagnosis Date  . Coronary artery disease   . Diabetes mellitus without complication   . Hypertension   . Depression    History reviewed. No pertinent past surgical history. Social History:  reports that she has quit smoking. She does not have any smokeless tobacco history on file. Her alcohol and drug histories are not on file.  No Known Allergies  History reviewed. No pertinent family history.  Prior to Admission medications   Not on File   Physical Exam: Filed Vitals:   07/06/14 1749 07/06/14 1810 07/06/14 1830  BP: 161/135  128/61  Pulse: 79  81  Temp:  98.2 F (36.8 C) 97.9 F (36.6 C)  TempSrc:  Rectal   Resp: 20  22  Height:  (1.727 m)    Weight: 90.719 kg (200 lb)    SpO2: 97%  97%     General:  No apparent distress  Eyes: PERRL, EOMI, no scleral icterus  ENT: dry oropharynx  Neck: supple, no lymphadenopathy  Cardiovascular: regular rate without MRG; 2+ peripheral pulses, no JVD, 2+ peripheral edema  Respiratory: basilar rales noted. Resp. Effort normal. No rhochi or wheezes.  Abdomen: soft, non tender to palpation, positive bowel sounds,  no guarding, no rebound  Skin: no rashes  Musculoskeletal: normal bulk and tone, no joint swelling  Psychiatric: unable to assess  Neurologic: CN 2-12 grossly intact, MS 5/5 in all 4  Labs on Admission:  Basic Metabolic Panel:  Recent Labs Lab 07/06/14 1756  NA 136  K 5.3*  CL 105  CO2 24  GLUCOSE 76  BUN 31*  CREATININE 2.48*  CALCIUM 8.1*   Liver Function Tests:  Recent Labs Lab 07/06/14 1756  AST 43*  ALT 17  ALKPHOS 124  BILITOT 2.9*  PROT 6.7  ALBUMIN 2.1*   No results for input(s): LIPASE, AMYLASE in the last 168 hours. No results for input(s): AMMONIA in the last 168 hours. CBC:  Recent Labs Lab 07/06/14 1756  WBC 11.0  NEUTROABS 8.9*  HGB 9.4*  HCT 29.8*  MCV 75.9*  PLT 199   Cardiac Enzymes:  Recent Labs Lab 07/06/14 1756  TROPONINI <0.03    BNP (last 3 results) No results for input(s): BNP in the last 8760 hours.  ProBNP (last 3 results) No results for input(s): PROBNP in the last 8760 hours.  CBG:  Recent Labs Lab 07/06/14 1741 07/06/14 1751 07/06/14 1841  GLUCAP 80 70 130*    Radiological Exams on Admission: Ct Head Wo Contrast  07/06/2014   CLINICAL DATA:  Hypoglycemia, unresponsive for most of the day  EXAM: CT  HEAD WITHOUT CONTRAST  TECHNIQUE: Contiguous axial images were obtained from the base of the skull through the vertex without intravenous contrast.  COMPARISON:  10/29/2013  FINDINGS: There is no evidence of mass effect, midline shift, or extra-axial fluid collections. There is no evidence of a space-occupying lesion or intracranial hemorrhage. There is no evidence of a cortical-based area of acute infarction. 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 There is generalized cerebral atrophy. There is periventricular white matter low attenuation likely secondary to microangiopathy.  The ventricles and sulci are appropriate for the patient's age. The basal cisterns are patent.  Visualized portions of the orbits  are unremarkable. The visualized portions of the paranasal sinuses and mastoid air cells are unremarkable. Cerebrovascular atherosclerotic calcifications are noted.  The osseous structures are unremarkable.  IMPRESSION: 1. No acute intracranial pathology. 2. Chronic microvascular disease and cerebral atrophy.   Electronically Signed   By: Elige Ko   On: 07/06/2014 18:35    EKG: Independently reviewed.  Assessment/Plan Principal Problem:   SIRS (systemic inflammatory response syndrome) Active Problems:   Acute UTI   Altered mental state   Hypoglycemia   ASCVD (arteriosclerotic cardiovascular disease)   Cultures have been sent. Will admit to telemetry with IV fluids and IV ABX and O2. Will check CXR and echo. Consult Cardiology and Neurology. PT eval. CSW consult for placement.She is DNR per her family. F/u labs in AM.  Diet: soft Fluids: D5NS@100  DVT Prophylaxis: SQ Heparin  Code Status: DNR  Family Communication: yes  Disposition Plan: SNF  Time spent: 50 min

## 2014-07-06 NOTE — ED Notes (Signed)
Patient becoming more alert, making eye contact, but continues to be unable to speak.

## 2014-07-07 ENCOUNTER — Inpatient Hospital Stay (HOSPITAL_COMMUNITY)
Admit: 2014-07-07 | Discharge: 2014-07-07 | Disposition: A | Discharge status: Home / Self Care | Payer: Medicare Other | Attending: Internal Medicine | Admitting: Internal Medicine

## 2014-07-07 DIAGNOSIS — I509 Heart failure, unspecified: Secondary | ICD-10-CM

## 2014-07-07 DIAGNOSIS — R4182 Altered mental status, unspecified: Secondary | ICD-10-CM

## 2014-07-07 LAB — COMPREHENSIVE METABOLIC PANEL
ALK PHOS: 90 U/L (ref 38–126)
ALT: 14 U/L (ref 14–54)
AST: 33 U/L (ref 15–41)
Albumin: 1.6 g/dL — ABNORMAL LOW (ref 3.5–5.0)
Anion gap: 4 — ABNORMAL LOW (ref 5–15)
BILIRUBIN TOTAL: 1.9 mg/dL — AB (ref 0.3–1.2)
BUN: 37 mg/dL — ABNORMAL HIGH (ref 6–20)
CO2: 25 mmol/L (ref 22–32)
CREATININE: 2.31 mg/dL — AB (ref 0.44–1.00)
Calcium: 7.5 mg/dL — ABNORMAL LOW (ref 8.9–10.3)
Chloride: 108 mmol/L (ref 101–111)
GFR, EST AFRICAN AMERICAN: 23 mL/min — AB (ref >60)
GFR, EST NON AFRICAN AMERICAN: 20 mL/min — AB (ref >60)
GLUCOSE: 117 mg/dL — AB (ref 65–99)
POTASSIUM: 4.4 mmol/L (ref 3.5–5.1)
Sodium: 137 mmol/L (ref 135–145)
Total Protein: 5.5 g/dL — ABNORMAL LOW (ref 6.5–8.1)

## 2014-07-07 LAB — CBC
HEMATOCRIT: 26.1 % — AB (ref 35.0–47.0)
Hemoglobin: 8.5 g/dL — ABNORMAL LOW (ref 12.0–16.0)
MCH: 24.6 pg — ABNORMAL LOW (ref 26.0–34.0)
MCHC: 32.5 g/dL (ref 32.0–36.0)
MCV: 75.7 fL — AB (ref 80.0–100.0)
Platelets: 131 10*3/uL — ABNORMAL LOW (ref 150–440)
RBC: 3.45 MIL/uL — ABNORMAL LOW (ref 3.80–5.20)
RDW: 24.2 % — AB (ref 11.5–14.5)
WBC: 7.5 10*3/uL (ref 3.6–11.0)

## 2014-07-07 LAB — GLUCOSE, CAPILLARY
GLUCOSE-CAPILLARY: 92 mg/dL (ref 65–99)
GLUCOSE-CAPILLARY: 53 mg/dL — AB (ref 65–99)
GLUCOSE-CAPILLARY: 119 mg/dL — AB (ref 65–99)
Glucose-Capillary: 110 mg/dL — ABNORMAL HIGH (ref 65–99)
Glucose-Capillary: 111 mg/dL — ABNORMAL HIGH (ref 65–99)
Glucose-Capillary: 125 mg/dL — ABNORMAL HIGH (ref 65–99)
Glucose-Capillary: 58 mg/dL — ABNORMAL LOW (ref 65–99)
Glucose-Capillary: 63 mg/dL — ABNORMAL LOW (ref 65–99)
Glucose-Capillary: 75 mg/dL (ref 65–99)
Glucose-Capillary: 94 mg/dL (ref 65–99)
Glucose-Capillary: 96 mg/dL (ref 65–99)

## 2014-07-07 LAB — TROPONIN I
Troponin I: <0.03 ng/mL (ref <0.031)
Troponin I: <0.03 ng/mL (ref <0.031)

## 2014-07-07 LAB — PROTIME-INR
INR: 2.03
INR: 1.94
PROTHROMBIN TIME: 23.1 s — AB (ref 11.4–15.0)
PROTHROMBIN TIME: 22.3 s — AB (ref 11.4–15.0)

## 2014-07-07 MED ORDER — DEXTROSE 50 % IV SOLN
INTRAVENOUS | Status: AC
  Administered 2014-07-07: 06:00:00
  Filled 2014-07-07: qty 50

## 2014-07-07 MED ORDER — DEXTROSE-NACL 5-0.9 % IV SOLN
INTRAVENOUS | Status: DC
Start: 2014-07-07 — End: 2014-07-07
  Administered 2014-07-07: 02:00:00 via INTRAVENOUS

## 2014-07-07 MED ORDER — DEXTROSE 50 % IV SOLN
25.0000 g | Freq: Once | INTRAVENOUS | Status: AC
  Administered 2014-07-07: 25 g via INTRAVENOUS

## 2014-07-07 MED ORDER — CETYLPYRIDINIUM CHLORIDE 0.05 % MT LIQD
7.0000 mL | Freq: Two times a day (BID) | OROMUCOSAL | Status: DC
  Administered 2014-07-07 – 2014-07-09 (×6): 7 mL via OROMUCOSAL

## 2014-07-07 MED ORDER — DEXTROSE 10 % IV SOLN
INTRAVENOUS | Status: DC
  Administered 2014-07-07 – 2014-07-08 (×3): via INTRAVENOUS

## 2014-07-07 MED ORDER — VANCOMYCIN HCL IN DEXTROSE 1-5 GM/200ML-% IV SOLN
1000.0000 mg | INTRAVENOUS | Status: DC
  Administered 2014-07-08: 1000 mg via INTRAVENOUS
  Filled 2014-07-07: qty 200

## 2014-07-07 MED ORDER — DEXTROSE 50 % IV SOLN
25.0000 mL | Freq: Once | INTRAVENOUS | Status: AC
  Administered 2014-07-07: 25 mL via INTRAVENOUS

## 2014-07-07 MED ORDER — DEXTROSE 50 % IV SOLN
1.0000 | INTRAVENOUS | Status: AC
  Administered 2014-07-07: 50 mL via INTRAVENOUS
  Filled 2014-07-07: qty 50

## 2014-07-07 MED ORDER — DEXTROSE 50 % IV SOLN
1.0000 | INTRAVENOUS | Status: DC | PRN
  Administered 2014-07-07 (×2): 50 mL via INTRAVENOUS
  Filled 2014-07-07 (×2): qty 50

## 2014-07-07 MED ORDER — DEXTROSE 50 % IV SOLN
INTRAVENOUS | Status: AC
  Filled 2014-07-07: qty 50

## 2014-07-07 MED ORDER — VANCOMYCIN HCL IN DEXTROSE 1-5 GM/200ML-% IV SOLN
1000.0000 mg | Freq: Once | INTRAVENOUS | Status: AC
  Administered 2014-07-07: 1000 mg via INTRAVENOUS
  Filled 2014-07-07: qty 200

## 2014-07-07 NOTE — Progress Notes (Addendum)
ANTIBIOTIC CONSULT NOTE - INITIAL  Pharmacy Consult for Vancomycin  Indication: rule out sepsis  No Known Allergies  Patient Measurements: Height: 5\' 8"  (172.7 cm) Weight: 209 lb 8 oz (95.029 kg) IBW/kg (Calculated) : 63.9 Adjusted Body Weight: 76.3 kg  Vital Signs: Temp: 98 F (36.7 C) (06/13 1147) BP: 104/50 mmHg (06/13 1951) Pulse Rate: 57 (06/13 1951) Intake/Output from previous day: 06/12 0701 - 06/13 0700 In: 915 [I.V.:615; IV Piggyback:300] Out: 800 [Urine:800] Intake/Output from this shift:    Labs:  Recent Labs  07/06/14 1756 07/07/14 0826 07/07/14 0931  WBC 11.0  --  7.5  HGB 9.4*  --  8.5*  PLT 199  --  131*  CREATININE 2.48* 2.31*  --    Estimated Creatinine Clearance: 25.7 mL/min (by C-G formula based on Cr of 2.31). No results for input(s): VANCOTROUGH, VANCOPEAK, VANCORANDOM, GENTTROUGH, GENTPEAK, GENTRANDOM, TOBRATROUGH, TOBRAPEAK, TOBRARND, AMIKACINPEAK, AMIKACINTROU, AMIKACIN in the last 72 hours.   Microbiology: Recent Results (from the past 720 hour(s))  Culture, blood (routine x 2)     Status: None (Preliminary result)   Collection Time: 07/06/14  5:56 PM  Result Value Ref Range Status   Specimen Description BLOOD  Final   Special Requests NONE  Final   Culture  Setup Time   Final    GRAM POSITIVE COCCI AEROBIC BOTTLE ONLY CRITICAL RESULT CALLED TO, READ BACK BY AND VERIFIED WITH:  CHARLOTTE KEY AT 1746 ON 07/07/14. CAF CONFIRMED BY [facility]    Culture   Final    GRAM POSITIVE COCCI AEROBIC BOTTLE ONLY IDENTIFICATION TO FOLLOW    Report Status PENDING  Incomplete  Culture, blood (routine x 2)     Status: None (Preliminary result)   Collection Time: 07/06/14  5:56 PM  Result Value Ref Range Status   Specimen Description BLOOD  Final   Special Requests NONE  Final   Culture NO GROWTH < 24 HOURS  Final   Report Status PENDING  Incomplete  Urine culture     Status: None (Preliminary result)   Collection Time: 07/06/14  6:04 PM  Result  Value Ref Range Status   Specimen Description URINE, CATHETERIZED  Final   Special Requests NONE  Final   Culture   Final    >=100,000 COLONIES/mL GRAM NEGATIVE RODS IDENTIFICATION AND SUSCEPTIBILITIES TO FOLLOW    Report Status PENDING  Incomplete    Medical History: Past Medical History  Diagnosis Date  . Coronary artery disease   . Diabetes mellitus without complication   . Hypertension   . Depression     Medications:  No prescriptions prior to admission   Assessment: CrCl = 25.7 ml/min Ke = 0.03 hr-1 T1/2 = 23.1 hrs Vd = 53.4 L Goal trough : 15 - 20 mcg/mL  Goal of Therapy:  Vancomycin trough level 15-20 mcg/ml  Plan:  Expected duration 7 days with resolution of temperature and/or normalization of WBC  Vancomycin 1 gm IV X 1 given on 6/13 @ 22:00.  Vancomycin 1 gm IV Q24H ordered to start on 6/14 @ 6:00, 8 hrs after 1st dose (stacked dosing). This pt will reach Css by 6/18 @ 22:00. Will draw 1st trough on 6/17 @ 5:30.   Hanad Leino D 07/07/2014,8:35 PM

## 2014-07-07 NOTE — Progress Notes (Signed)
Blood culture in aerobic bottle positive  For gram positive cocci, DR. Vaickute notified with new order for Vancomycin 1000 mg/200 mL ordered to be administered. CBG at 8 pm is 110, will continue to monitor.

## 2014-07-07 NOTE — Consult Note (Signed)
CC: AMS, aphasia   HPI: Katie Travis is an 74 y.o. female with past medical history significant for ASCVD, HTN, and DM who presents to the ER with lethargy and weakness x 1-2 days. Pt has been non-compliant at home and has not taken any medications in several months. Family states she has not gone to the doctor in almost a year. Information from son that is visiting from [place].  Pt found to have UTI which is being treated and hypoglycemia.  Mental status improving, speech improving.    Past Medical History  Diagnosis Date  . Coronary artery disease   . Diabetes mellitus without complication   . Hypertension   . Depression     History reviewed. No pertinent past surgical history.  History reviewed. No pertinent family history.  Social History:  reports that she has quit smoking. She does not have any smokeless tobacco history on file. Her alcohol and drug histories are not on file.  No Known Allergies  Medications: I have reviewed the patient's current medications.  ROS: Unable to obtain   Physical Examination: Blood pressure 94/33, pulse 61, temperature 98 F (36.7 C), temperature source Oral, resp. rate 18, height 5\' 8"  (1.727 m), weight 95.029 kg (209 lb 8 oz), SpO2 100 %.  EOM intact Aphasic speech, but improving No facial assymetry Generalized weakness through out Diminished reflexes.    Laboratory Studies:   Basic Metabolic Panel:  Recent Labs Lab 07/06/14 1756 07/07/14 0826  NA 136 137  K 5.3* 4.4  CL 105 108  CO2 24 25  GLUCOSE 76 117*  BUN 31* 37*  CREATININE 2.48* 2.31*  CALCIUM 8.1* 7.5*    Liver Function Tests:  Recent Labs Lab 07/06/14 1756 07/07/14 0826  AST 43* 33  ALT 17 14  ALKPHOS 124 90  BILITOT 2.9* 1.9*  PROT 6.7 5.5*  ALBUMIN 2.1* 1.6*   No results for input(s): LIPASE, AMYLASE in the last 168 hours. No results for input(s): AMMONIA in the last 168 hours.  CBC:  Recent Labs Lab 07/06/14 1756 07/07/14 0931  WBC 11.0  7.5  NEUTROABS 8.9*  --   HGB 9.4* 8.5*  HCT 29.8* 26.1*  MCV 75.9* 75.7*  PLT 199 131*    Cardiac Enzymes:  Recent Labs Lab 07/06/14 1756 07/06/14 2236 07/07/14 0237 07/07/14 0826  TROPONINI <0.03 <0.03 <0.03 <0.03    BNP: Invalid input(s): POCBNP  CBG:  Recent Labs Lab 07/07/14 0452 07/07/14 0729 07/07/14 0904 07/07/14 1144 07/07/14 1320  GLUCAP 111* 63* 92 53* 96    Microbiology: Results for orders placed or performed during the hospital encounter of 07/06/14  Culture, blood (routine x 2)     Status: None (Preliminary result)   Collection Time: 07/06/14  5:56 PM  Result Value Ref Range Status   Specimen Description BLOOD  Final   Special Requests NONE  Final   Culture NO GROWTH < 24 HOURS  Final   Report Status PENDING  Incomplete  Culture, blood (routine x 2)     Status: None (Preliminary result)   Collection Time: 07/06/14  5:56 PM  Result Value Ref Range Status   Specimen Description BLOOD  Final   Special Requests NONE  Final   Culture NO GROWTH < 24 HOURS  Final   Report Status PENDING  Incomplete  Urine culture     Status: None (Preliminary result)   Collection Time: 07/06/14  6:04 PM  Result Value Ref Range Status   Specimen Description URINE,  CATHETERIZED  Final   Special Requests NONE  Final   Culture   Final    >=100,000 COLONIES/mL GRAM NEGATIVE RODS IDENTIFICATION AND SUSCEPTIBILITIES TO FOLLOW    Report Status PENDING  Incomplete    Coagulation Studies:  Recent Labs  07/06/14 1756 07/07/14 0826 07/07/14 1317  LABPROT 20.2* 23.1* 22.3*  INR 1.70 2.03 1.94    Urinalysis:  Recent Labs Lab 07/06/14 1804  COLORURINE AMBER*  LABSPEC 1.015  PHURINE 5.0  GLUCOSEU NEGATIVE  HGBUR NEGATIVE  BILIRUBINUR NEGATIVE  KETONESUR NEGATIVE  PROTEINUR 30*  NITRITE NEGATIVE  LEUKOCYTESUR 2+*    Lipid Panel:  No results found for: CHOL, TRIG, HDL, CHOLHDL, VLDL, LDLCALC  HgbA1C: No results found for: HGBA1C  Urine Drug Screen:   No results found for: LABOPIA, COCAINSCRNUR, LABBENZ, AMPHETMU, THCU, LABBARB  Alcohol Level: No results for input(s): ETH in the last 168 hours.    Imaging: Dg Chest 1 View  07/06/2014   CLINICAL DATA:  Altered mental status and unresponsive.  EXAM: CHEST  1 VIEW  COMPARISON:  12/26/2013 and prior radiographs  FINDINGS: Mild cardiomegaly noted.  Mild pulmonary vascular congestion is identified.  There is no evidence of focal airspace disease, pulmonary edema, suspicious pulmonary nodule/mass, pleural effusion, or pneumothorax. No acute bony abnormalities are identified.  Remote left-sided rib fractures again identified.  IMPRESSION: Mild cardiomegaly with mild pulmonary vascular congestion.   Electronically Signed   By: Harmon Pier M.D.   On: 07/06/2014 20:28   Ct Head Wo Contrast  07/06/2014   CLINICAL DATA:  Hypoglycemia, unresponsive for most of the day  EXAM: CT HEAD WITHOUT CONTRAST  TECHNIQUE: Contiguous axial images were obtained from the base of the skull through the vertex without intravenous contrast.  COMPARISON:  10/29/2013  FINDINGS: There is no evidence of mass effect, midline shift, or extra-axial fluid collections. There is no evidence of a space-occupying lesion or intracranial hemorrhage. There is no evidence of a cortical-based area of acute infarction. 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 There is generalized cerebral atrophy. There is periventricular white matter low attenuation likely secondary to microangiopathy.  The ventricles and sulci are appropriate for the patient's age. The basal cisterns are patent.  Visualized portions of the orbits are unremarkable. The visualized portions of the paranasal sinuses and mastoid air cells are unremarkable. Cerebrovascular atherosclerotic calcifications are noted.  The osseous structures are unremarkable.  IMPRESSION: 1. No acute intracranial pathology. 2. Chronic microvascular disease and cerebral atrophy.   Electronically  Signed   By: Elige Ko   On: 07/06/2014 18:35     Assessment/Plan: 74 y.o. female with past medical history significant for ASCVD, HTN, and DM who presents to the ER with lethargy and weakness x 1-2 days. Pt has been non-compliant at home and has not taken any medications in several months. Family states she has not gone to the doctor in almost a year. Information from son that is visiting from [place].  Pt found to have UTI which is being treated and hypoglycemia.  Mental status improving, speech improving.   CTH no acute abnormalities  - d/w with son who is visiting from [place]. Mental status has been deteriorating, but improved this AM as being treated for hypoglycemia and UTI - CTH no acute abnormality, would not order MRI at this point - hydration  Katie Travis   07/07/2014, 2:24 PM

## 2014-07-07 NOTE — Care Management (Signed)
Patient presents from home.  Her youngest son was her primary caregiver.  Spoke with patient's middle son Katie Quaker- who has just moved from [place] to provide care for patient.  There is an older son "but he is no use- he is just waiting for mother to die."  Katie Travis says that his younger brother Katie Travis, who is "mentally slow"  is not able to provide care or recognize health issues that need to be be addressed. Patient is a relatively new diabetic.  Patient has not taken any of her meds for the past 4 months  It is not clear whether she was refusing them of if Katie Travis knew how to give them or manage patient's care.   Patient has 02 in the home through [employer] but unable to determine if it is nocturnal or continuous.  Will contact [employer]. Physical therapy has recommended skilled nursing but Katie Travis is firm in his decision to take patient home.  He has already obtained a wheelchair and installed grab bars in the bath.  Patient has a tub bench. Requests a hospital bed, hoyer lift and may need BSC.  Agreeable to home health nursing, PT and aide.  No agency preference. Patient is DNR.  There are POA legal documents that deal with "medical decisions."  Have asked these to be brought to the unit and copied for the medial chart.

## 2014-07-07 NOTE — Progress Notes (Addendum)
[facility] [place] at [facility]   PATIENT NAME: Katie Travis    MR#:  [id number]  DATE OF BIRTH:  November 24, 1940  SUBJECTIVE:  Patient is drowsy this morning  REVIEW OF SYSTEMS:    Review of Systems  Unable to perform ROS: medical condition    Tolerating Diet:NO due to her mental status    DRUG ALLERGIES:  No Known Allergies  VITALS:  Blood pressure 104/57, pulse 68, temperature 98.1 F (36.7 C), temperature source Oral, resp. rate 20, height  (1.727 m), weight 95.029 kg (209 lb 8 oz), SpO2 98 %.  PHYSICAL EXAMINATION:   Physical Exam  Constitutional: She is well-developed, well-nourished, and in no distress.  HENT:  Head: Normocephalic.  Eyes: No scleral icterus.  Neck: No tracheal deviation present.  Cardiovascular: Normal rate and regular rhythm.   No murmur heard. Pulmonary/Chest: No respiratory distress. She has no wheezes. She has rales. She exhibits no tenderness.  Abdominal: Bowel sounds are normal. She exhibits no mass. There is tenderness. There is no rebound and no guarding.  Musculoskeletal: Normal range of motion. She exhibits no edema.  Neurological:  Lethargic but according to nurse she was awake this morning.  Skin: Skin is warm. She is not diaphoretic.      LABORATORY PANEL:   CBC  Recent Labs Lab 07/07/14 0931  WBC 7.5  HGB 8.5*  HCT 26.1*  PLT 131*   ------------------------------------------------------------------------------------------------------------------  Chemistries   Recent Labs Lab 07/07/14 0826  NA 137  K 4.4  CL 108  CO2 25  GLUCOSE 117*  BUN 37*  CREATININE 2.31*  CALCIUM 7.5*  AST 33  ALT 14  ALKPHOS 90  BILITOT 1.9*   ------------------------------------------------------------------------------------------------------------------  Cardiac Enzymes  Recent Labs Lab 07/06/14 2236 07/07/14 0237 07/07/14 0826  TROPONINI <0.03 <0.03 <0.03    ------------------------------------------------------------------------------------------------------------------  RADIOLOGY:  Dg Chest 1 View  07/06/2014   identified.  IMPRESSION: Mild cardiomegaly with mild pulmonary vascular congestion.   Electronically Signed   By: Harmon Pier M.D.   On: 07/06/2014 20:28   Ct Head Wo Contrast .  IMPRESSION: 1. No acute intracranial pathology. 2. Chronic microvascular disease and cerebral atrophy.   Electronically Signed   By: Elige Ko   On: 07/06/2014 18:35     ASSESSMENT AND PLAN:   This is a 74 year old female with a history of diabetes/CAD with a stent, hypertension and depression who presented with altered mental status was found to have hypoglycemia.  1. Hypoglycemia: Patient continues to have hypoglycemia despite being on D5 and D10. I will need to increase her D10. We will need to continue to monitor her blood sugars.  2. Acute cystitis without hematuria: Patient is on IV Rocephin for her urinary tract infection. I will follow up on blood and urine cultures that were ordered in the emergency department.  3. History of CAD and stent: Patient will need to continue on Plavix when she is more we'll awake and alert.  4. Diabetes: At this time due to her hypoglycemia I am holding all medications. I will check a hemoglobin A1c. She will need endocrinology follow-up as an outpatient.   5. Depression: Patient is supposed to be on medications for depression. We are holding his medications due to her current mental status. She has been noncompliant with medications for several months. When she is more alert and may consider a psychiatry consultation.  6. Essential hypertension: Patient's blood pressure is low/normal. I will continue to monitor.  7. Acute renal failure: I clearly this is ATN secondary to hypoglycemia. She may have a component of prerenal azotemia as well. I'm holding all nephrotoxic agents. We will continue with IV fluids as  stated above. I will repeat a BMP in the a.m. Her creatinine seems to slowly improve.  She needs case management and physical therapy consultation once she is more alert.   Management plans discussed with the patient's son who is at bedside and he is in agreement.  CODE STATUS: DO NOT RESUSCITATE  TOTAL TIME TAKING CARE OF THIS PATIENT: 35 minutes.   POSSIBLE D/C IN 2-3 DAYS, DEPENDING ON CLINICAL CONDITION.   Commodore Bellew M.D on 07/07/2014 at 10:34 AM  Between 7am to 6pm - Pager - [phone] After 6pm go to [email or web address] - password [email or web address] [facility]  [facility]  Office  [phone]  CC: Primary care physician; Mila Merry, MD

## 2014-07-07 NOTE — Progress Notes (Signed)
Notified Dr. Betti Cruz of BG of 50 and the need to give IV dextrose. Notified of BP of 94/42. NO new orders.

## 2014-07-07 NOTE — Progress Notes (Signed)
Notified Dr. Anne Hahn of BG in the 50s and the need to give IV dextrose. D10 infusion started.

## 2014-07-07 NOTE — Progress Notes (Signed)
Pt blood glucose was 63, called dr Juliene Pina for a PRN order for D50 and to give an ample now and order was given. Will recheck pt again at 9am for scheduled monitoring.

## 2014-07-07 NOTE — Progress Notes (Signed)
*  PRELIMINARY RESULTS* Echocardiogram 2D Echocardiogram has been performed.  Georgann Housekeeper Hege 07/07/2014, 12:56 PM

## 2014-07-07 NOTE — Consult Note (Signed)
[facility] Cardiology Consultation Note  Patient ID: Katie Travis, MRN: [id number], DOB/AGE: 01-Jan-1941 74 y.o. Admit date: 07/06/2014   Date of Consult: 07/07/2014 Primary Physician: Mila Merry, MD Primary Cardiologist: None  Chief Complaint:  Chief Complaint  Patient presents with  . Hypoglycemia   Reason for Consult: elevated troponin with known chronic systolic dysfunction heart failure coronary artery disease essential hypertension  HPI: 74 y.o. female with known coronary artery disease in the past with essential hypertension diabetes with complications having acute systolic dysfunction heart failure with elevated troponin of 0.03 more consistent with demand ischemia rather than acute coronary syndrome. The patient has not taken any medication management has not been under the care of a doctor for a while and therefore has had some issues. The patient did arrive with EKG showing normal sinus rhythm. Normal EKG. She does have chronic kidney disease stage IV with anemia likely exacerbating her heart failure. There has been some improvements with oxygenation although the patient continues to be significantly weak and fatigued at this time  Past Medical History  Diagnosis Date  . Coronary artery disease   . Diabetes mellitus without complication   . Hypertension   . Depression       Surgical History: History reviewed. No pertinent past surgical history.   Home Meds: Prior to Admission medications   Not on File    Inpatient Medications:  . antiseptic oral rinse  7 mL Mouth Rinse BID  . aspirin EC  81 mg Oral Daily  . azithromycin  500 mg Intravenous Q24H  . cefTRIAXone (ROCEPHIN)  IV  1 g Intravenous Q24H  . docusate sodium  100 mg Oral BID  . famotidine (PEPCID) IV  20 mg Intravenous Q12H  . insulin aspart  0-9 Units Subcutaneous 6 times per day  . ipratropium-albuterol  3 mL Nebulization QID   . dextrose 100 mL/hr at 07/07/14 1614    Allergies: No Known  Allergies  History   Social History  . Marital Status: Widowed    Spouse Name: N/A  . Number of Children: N/A  . Years of Education: N/A   Occupational History  . Not on file.   Social History Main Topics  . Smoking status: Former Games developer  . Smokeless tobacco: Not on file  . Alcohol Use: Not on file  . Drug Use: Not on file  . Sexual Activity: Not on file   Other Topics Concern  . Not on file   Social History Narrative  . No narrative on file     History reviewed. No pertinent family history.   Review of Systems Positive for weak and fatigue Negative for: General:  chills, fever, night sweats or weight changes.  Cardiovascular: PND orthopnea syncope dizziness  Dermatological skin lesions rashes Respiratory: Cough congestion Urologic: Frequent urination urination at night and hematuria Abdominal: negative for nausea, vomiting, diarrhea, bright red blood per rectum, melena, or hematemesis Neurologic: negative for visual changes, and/or hearing changes  All other systems reviewed and are otherwise negative except as noted above.  Labs:  Recent Labs  07/06/14 1756 07/06/14 2236 07/07/14 0237 07/07/14 0826  TROPONINI <0.03 <0.03 <0.03 <0.03   Lab Results  Component Value Date   WBC 7.5 07/07/2014   HGB 8.5* 07/07/2014   HCT 26.1* 07/07/2014   MCV 75.7* 07/07/2014   PLT 131* 07/07/2014    Recent Labs Lab 07/07/14 0826  NA 137  K 4.4  CL 108  CO2 25  BUN 37*  CREATININE  2.31*  CALCIUM 7.5*  PROT 5.5*  BILITOT 1.9*  ALKPHOS 90  ALT 14  AST 33  GLUCOSE 117*   No results found for: CHOL, HDL, LDLCALC, TRIG No results found for: DDIMER  Radiology/Studies:  Dg Chest 1 View  07/06/2014   CLINICAL DATA:  Altered mental status and unresponsive.  EXAM: CHEST  1 VIEW  COMPARISON:  12/26/2013 and prior radiographs  FINDINGS: Mild cardiomegaly noted.  Mild pulmonary vascular congestion is identified.  There is no evidence of focal airspace disease,  pulmonary edema, suspicious pulmonary nodule/mass, pleural effusion, or pneumothorax. No acute bony abnormalities are identified.  Remote left-sided rib fractures again identified.  IMPRESSION: Mild cardiomegaly with mild pulmonary vascular congestion.   Electronically Signed   By: Harmon Pier M.D.   On: 07/06/2014 20:28   Ct Head Wo Contrast  07/06/2014   CLINICAL DATA:  Hypoglycemia, unresponsive for most of the day  EXAM: CT HEAD WITHOUT CONTRAST  TECHNIQUE: Contiguous axial images were obtained from the base of the skull through the vertex without intravenous contrast.  COMPARISON:  10/29/2013  FINDINGS: There is no evidence of mass effect, midline shift, or extra-axial fluid collections. There is no evidence of a space-occupying lesion or intracranial hemorrhage. There is no evidence of a cortical-based area of acute infarction. 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 There is generalized cerebral atrophy. There is periventricular white matter low attenuation likely secondary to microangiopathy.  The ventricles and sulci are appropriate for the patient's age. The basal cisterns are patent.  Visualized portions of the orbits are unremarkable. The visualized portions of the paranasal sinuses and mastoid air cells are unremarkable. Cerebrovascular atherosclerotic calcifications are noted.  The osseous structures are unremarkable.  IMPRESSION: 1. No acute intracranial pathology. 2. Chronic microvascular disease and cerebral atrophy.   Electronically Signed   By: Elige Ko   On: 07/06/2014 18:35    EKG: Normal sinus rhythm otherwise normal EKG  Weights: Filed Weights   07/06/14 1749 07/06/14 2316 07/07/14 0607  Weight: 200 lb (90.719 kg) 208 lb 12.8 oz (94.711 kg) 209 lb 8 oz (95.029 kg)     Physical Exam: Blood pressure 112/45, pulse 62, temperature 98 F (36.7 C), temperature source Oral, resp. rate 20, height  (1.727 m), weight 209 lb 8 oz (95.029 kg), SpO2 97 %. Body mass  index is 31.86 kg/(m^2). General: Well developed, well nourished, in no acute distress. Head eyes ears nose throat: Normocephalic, atraumatic, sclera non-icteric, no xanthomas, nares are without discharge. No apparent thyromegaly and/or mass  Lungs: Normal respiratory effort.  no wheezes, no rales, no rhonchi.  Heart: RRR with normal S1 S2. no murmur gallop, no rub, PMI is normal size and placement, carotid upstroke normal without bruit, jugular venous pressure is normal Abdomen: Soft, non-tender, non-distended with normoactive bowel sounds. No hepatomegaly. No rebound/guarding. No obvious abdominal masses. Abdominal aorta is normal size without bruit Extremities: No edema. no cyanosis, no clubbing, no ulcers  Peripheral : 2+ bilateral upper extremity pulses, 2+ bilateral femoral pulses, 2+ bilateral dorsal pedal pulse Neuro: Alert and oriented. No facial asymmetry. No focal deficit. Moves all extremities spontaneously. Musculoskeletal: Normal muscle tone without kyphosis Psych:  Responds to questions appropriately with a normal affect.    Assessment: 74 year old female with known coronary artery disease essential hypertension diabetes with complication chronic kidney disease stage IV with significant anemia causing elevated troponin consistent with demand ischemia rather than acute coronary syndrome and exacerbating acute on chronic systolic  dysfunction heart failure with some pulmonary edema  Plan: 1. Gentle diuresis if able for pulmonary edema and or chest x-ray changes with hypoxia 2. Continue for treatment of anemia which is likely exacerbating above 3. No further intervention of elevated troponin most consistent with the demand ischemia rather than acute coronary syndrome 4. Consider echocardiogram for LV systolic dysfunction valvular heart disease exacerbating above and causing weakness and fatigue 5. Further assessment and treatment after potential ambulation  Signed, Lamar Blinks  M.D. [facility] Cardiology 07/07/2014, 6:39 PM

## 2014-07-07 NOTE — Clinical Social Work Note (Signed)
CSW was notified by [facility] that family is adamantly refusing SNF placement.  CSW attempted to confirm this but pt was no in the room at the time.  CSW will attempt to confirm again tomorrow.

## 2014-07-07 NOTE — Evaluation (Signed)
Physical Therapy Evaluation Patient Details Name: Katie Travis MRN: [id number] DOB: 1940-03-04 Today's Date: 07/07/2014   History of Present Illness  Pt is a 74 year old female admitted to the hospital with weakness and shortness of breath.    Clinical Impression  Evaluation revealed global deconditioning, inability to understand task secondary to cognitive impairments, as well as lack of motivation to attempt therapy. Pt refuses to be admitted to a SNF, and her son will be the primary caretaker of her at home, where she wishes to discharged to. Son states that she has days when she is able to move and communicate much better than this day. He also is aware of the needs for the home environment should they choose to discharge to home. Pt would benefit from skilled physical therapy at a skilled nursing facility gains in strength, endurance, and balance, in order to safely live in her home.     Follow Up Recommendations SNF    Equipment Recommendations  Hospital bed (Lift, ramp, )    Recommendations for Other Services       Precautions / Restrictions Precautions Precautions: Fall Restrictions Weight Bearing Restrictions: No      Mobility  Bed Mobility Overal bed mobility: +2 for physical assistance;Needs Assistance Bed Mobility: Supine to Sit     Supine to sit: Max assist;+2 for physical assistance     General bed mobility comments: Pt requires max assist +2 for physical support, also requires cueing to stay sitting and hold her balance.   Transfers                 General transfer comment: Transfers not performed due to lethargy and impaired cognition.   Ambulation/Gait                Stairs            Wheelchair Mobility    Modified Rankin (Stroke Patients Only)       Balance Overall balance assessment: Needs assistance Sitting-balance support: Bilateral upper extremity supported Sitting balance-Leahy Scale: Poor Sitting balance - Comments:  Pt requires min assist for correction to remain in sitting balance. Pt can hold balance for no longer than 10 seconds at a time.                                     Pertinent Vitals/Pain Pain Assessment:  (Pt states pain in legs)    Home Living Family/patient expects to be discharged to:: Private residence   Available Help at Discharge:  (Her son is available) Type of Home: House Home Access: Stairs to enter (Son intends to put a ramp in)   Secretary/administrator of Steps: 5   Home Equipment: Emergency planning/management officer - 2 wheels;Grab bars - tub/shower;Transport chair Additional Comments: Son acknowledges that additional items will be required in order for patient to live there safely.     Prior Function Level of Independence: Needs assistance   Gait / Transfers Assistance Needed: Son would help her transfer and ambulate           Hand Dominance        Extremity/Trunk Assessment   Upper Extremity Assessment: Generalized weakness;Difficult to assess due to impaired cognition (MMT grossly 2/5)           Lower Extremity Assessment: Difficult to assess due to impaired cognition;Generalized weakness         Communication   Communication:  Expressive difficulties;Receptive difficulties (Patient was lethargic and not communicating effectively )  Cognition Arousal/Alertness: Lethargic Behavior During Therapy: Flat affect Overall Cognitive Status: Difficult to assess                      General Comments      Exercises        Assessment/Plan    PT Assessment Patient needs continued PT services  PT Diagnosis Generalized weakness;Altered mental status   PT Problem List Decreased strength;Decreased range of motion;Decreased activity tolerance;Decreased balance;Decreased mobility;Decreased cognition;Decreased safety awareness;Pain  PT Treatment Interventions Balance training;Patient/family education   PT Goals (Current goals can be found in the  Care Plan section) Acute Rehab PT Goals Patient Stated Goal: To return home PT Goal Formulation: With family Time For Goal Achievement: 07/21/14 Potential to Achieve Goals: Fair    Frequency Min 2X/week   Barriers to discharge   If pt is to be D/C'd to home, house modifications need to be made. These needs were discussed with son    Co-evaluation               End of Session   Activity Tolerance: Patient limited by fatigue;Patient limited by lethargy;Patient limited by pain;Other (comment) (limited by cognition) Patient left: in bed;with call bell/phone within reach;with bed alarm set           Time: 1010-1048 PT Time Calculation (min) (ACUTE ONLY): 38 min   Charges:         PT G CodesBenna Dunks 2014-07-13, 12:06 PM

## 2014-07-08 ENCOUNTER — Inpatient Hospital Stay: Payer: Medicare Other

## 2014-07-08 DIAGNOSIS — F0390 Unspecified dementia without behavioral disturbance: Secondary | ICD-10-CM

## 2014-07-08 DIAGNOSIS — A419 Sepsis, unspecified organism: Secondary | ICD-10-CM

## 2014-07-08 DIAGNOSIS — R410 Disorientation, unspecified: Secondary | ICD-10-CM

## 2014-07-08 LAB — URINE CULTURE

## 2014-07-08 LAB — GLUCOSE, CAPILLARY
GLUCOSE-CAPILLARY: 145 mg/dL — AB (ref 65–99)
GLUCOSE-CAPILLARY: 155 mg/dL — AB (ref 65–99)
GLUCOSE-CAPILLARY: 128 mg/dL — AB (ref 65–99)
Glucose-Capillary: 135 mg/dL — ABNORMAL HIGH (ref 65–99)
Glucose-Capillary: 141 mg/dL — ABNORMAL HIGH (ref 65–99)
Glucose-Capillary: 142 mg/dL — ABNORMAL HIGH (ref 65–99)
Glucose-Capillary: 143 mg/dL — ABNORMAL HIGH (ref 65–99)
Glucose-Capillary: 149 mg/dL — ABNORMAL HIGH (ref 65–99)
Glucose-Capillary: 150 mg/dL — ABNORMAL HIGH (ref 65–99)

## 2014-07-08 LAB — BASIC METABOLIC PANEL
Anion gap: 5 (ref 5–15)
BUN: 35 mg/dL — AB (ref 6–20)
CALCIUM: 7.3 mg/dL — AB (ref 8.9–10.3)
CO2: 24 mmol/L (ref 22–32)
CREATININE: 2.21 mg/dL — AB (ref 0.44–1.00)
Chloride: 105 mmol/L (ref 101–111)
GFR, EST AFRICAN AMERICAN: 24 mL/min — AB (ref >60)
GFR, EST NON AFRICAN AMERICAN: 21 mL/min — AB (ref >60)
Glucose, Bld: 154 mg/dL — ABNORMAL HIGH (ref 65–99)
POTASSIUM: 4.2 mmol/L (ref 3.5–5.1)
SODIUM: 134 mmol/L — AB (ref 135–145)

## 2014-07-08 LAB — PROTIME-INR
INR: 2.09
PROTHROMBIN TIME: 23.6 s — AB (ref 11.4–15.0)

## 2014-07-08 LAB — HEMOGLOBIN A1C: Hgb A1c MFr Bld: 4.6 % (ref 4.0–6.0)

## 2014-07-08 MED ORDER — CLONAZEPAM 0.5 MG PO TABS
0.5000 mg | ORAL_TABLET | Freq: Once | ORAL | Status: AC
  Administered 2014-07-08: 0.5 mg via ORAL
  Filled 2014-07-08: qty 1

## 2014-07-08 MED ORDER — PNEUMOCOCCAL VAC POLYVALENT 25 MCG/0.5ML IJ INJ
0.5000 mL | INJECTION | INTRAMUSCULAR | Status: AC
  Administered 2014-07-09: 0.5 mL via INTRAMUSCULAR
  Filled 2014-07-08: qty 0.5

## 2014-07-08 MED ORDER — VANCOMYCIN HCL IN DEXTROSE 1-5 GM/200ML-% IV SOLN: 1000.0000 mg | INTRAVENOUS | Status: DC

## 2014-07-08 MED ORDER — INSULIN ASPART 100 UNIT/ML ~~LOC~~ SOLN: 0.0000 [IU] | Freq: Three times a day (TID) | SUBCUTANEOUS | Status: DC

## 2014-07-08 MED ORDER — LIVING WELL WITH DIABETES BOOK
Freq: Once | Status: AC
  Administered 2014-07-08: 16:00:00
  Filled 2014-07-08: qty 1

## 2014-07-08 MED ORDER — INSULIN ASPART 100 UNIT/ML ~~LOC~~ SOLN: 0.0000 [IU] | Freq: Every day | SUBCUTANEOUS | Status: DC

## 2014-07-08 MED ORDER — FUROSEMIDE 10 MG/ML IJ SOLN
40.0000 mg | Freq: Two times a day (BID) | INTRAMUSCULAR | Status: DC
  Administered 2014-07-08 (×2): 40 mg via INTRAVENOUS
  Filled 2014-07-08 (×2): qty 4

## 2014-07-08 NOTE — Progress Notes (Signed)
Inpatient Diabetes Program Recommendations  AACE/ADA: New Consensus Statement on Inpatient Glycemic Control (2013)  Target Ranges:  Prepandial:   less than 140 mg/dL      Peak postprandial:   less than 180 mg/dL (1-2 hours)      Critically ill patients:  140 - 180 mg/dL   Results for Katie Travis, Katie Travis (MRN [id number]) as of 07/08/2014 14:45  Ref. Range 07/08/2014 04:10 07/08/2014 05:57 07/08/2014 07:29 07/08/2014 10:04 07/08/2014 11:43  Glucose-Capillary Latest Ref Range: 65-99 mg/dL 517 (H) 616 (H) 073 (H) 150 (H) 141 (H)    Reason for visit: consult  Diabetes history: Type 2 Outpatient Diabetes medications: unsure Current orders for Inpatient glycemic control: Novolog correction 0-9 units tid with meals-not requiring any while inpatient  Based on poor dietary intake and recent blood sugars, consider d/c home without any diabetes medications but follow up with family physician on discharge. Discussed with Dr. Juliene Pina.  Living Well with diabetes ordered for the patient and given to her son Jonny Ruiz.   Please send patient home with an written order for a glucometer and glucose testing strips.   Susette Racer, RN, BA, MHA, CDE Diabetes Coordinator Inpatient Diabetes Program  [phone] (Team Pager) [phone] [facility] Office) 07/08/2014 2:49 PM

## 2014-07-08 NOTE — Progress Notes (Signed)
Pain noted 0/10.   Tolerated breakfast well, ate 80% of her meal.

## 2014-07-08 NOTE — Progress Notes (Signed)
[facility] - [place] [facility]   PATIENT NAME: Katie Travis    MR#:  [id number]  DATE OF BIRTH:  Mar 29, 1940  SUBJECTIVE:  Patient is awake this morning. Patient has no complaints. Patient says she stopped taking her medications a proximal a 4-5 months ago because she thought that doctors were harming her.  REVIEW OF SYSTEMS:    Review of Systems  Constitutional: Negative for fever, chills and malaise/fatigue.  HENT: Negative for sore throat.   Eyes: Negative for blurred vision.  Respiratory: Negative for cough, hemoptysis, shortness of breath and wheezing.   Cardiovascular: Negative for chest pain, palpitations and leg swelling.  Gastrointestinal: Negative for nausea, vomiting, abdominal pain, diarrhea and blood in stool.  Genitourinary: Negative for dysuria.  Musculoskeletal: Negative for back pain.  Neurological: Negative for dizziness, tremors and headaches.  Endo/Heme/Allergies: Does not bruise/bleed easily.    Tolerating Diet:NO due to her mental status    DRUG ALLERGIES:  No Known Allergies  VITALS:  Blood pressure 103/54, pulse 69, temperature 98.3 F (36.8 C), temperature source Oral, resp. rate 16, height 5\' 8"  (1.727 m), weight 97.614 kg (215 lb 3.2 oz), SpO2 100 %.  PHYSICAL EXAMINATION:   Physical Exam  Constitutional: She is oriented to person, place, and time and well-developed, well-nourished, and in no distress. No distress.  HENT:  Head: Normocephalic.  Eyes: No scleral icterus.  Neck: Normal range of motion. Neck supple. No JVD present. No tracheal deviation present.  Cardiovascular: Normal rate, regular rhythm and normal heart sounds.  Exam reveals no gallop and no friction rub.   No murmur heard. Pulmonary/Chest: Effort normal. No respiratory distress. She has no wheezes. She has no rales. She exhibits no tenderness.  Abdominal: Soft. Bowel sounds are normal. She exhibits no distension and no mass. There is no tenderness.  There is no rebound and no guarding.  Musculoskeletal: Normal range of motion. She exhibits no edema.  Neurological: She is alert and oriented to person, place, and time.  Skin: Skin is warm. No rash noted. She is not diaphoretic. No erythema.  Psychiatric: Affect and judgment normal.      LABORATORY PANEL:   CBC  Recent Labs Lab 07/07/14 0931  WBC 7.5  HGB 8.5*  HCT 26.1*  PLT 131*   ------------------------------------------------------------------------------------------------------------------  Chemistries   Recent Labs Lab 07/07/14 0826 07/08/14 0510  NA 137 134*  K 4.4 4.2  CL 108 105  CO2 25 24  GLUCOSE 117* 154*  BUN 37* 35*  CREATININE 2.31* 2.21*  CALCIUM 7.5* 7.3*  AST 33  --   ALT 14  --   ALKPHOS 90  --   BILITOT 1.9*  --    ------------------------------------------------------------------------------------------------------------------  Cardiac Enzymes  Recent Labs Lab 07/06/14 2236 07/07/14 0237 07/07/14 0826  TROPONINI <0.03 <0.03 <0.03   ------------------------------------------------------------------------------------------------------------------  RADIOLOGY:  Dg Chest 1 View  07/06/2014   identified.  IMPRESSION: Mild cardiomegaly with mild pulmonary vascular congestion.   Electronically Signed   By: Harmon Pier M.D.   On: 07/06/2014 20:28   Ct Head Wo Contrast .  IMPRESSION: 1. No acute intracranial pathology. 2. Chronic microvascular disease and cerebral atrophy.   Electronically Signed   By: Elige Ko   On: 07/06/2014 18:35     ASSESSMENT AND PLAN:   This is a 74 year old female with a history of diabetes/CAD with a stent, hypertension and depression who presented with altered mental status was found to have hypoglycemia.  1. Hypoglycemia: Hypoglycemia  has resolved. We will stop D10 and monitor blood sugars. I will place her on a scale insulin.   2. Acute cystitis without hematuria: Patient is on IV Rocephin for her  urinary tract infection. Urine cultures growing out gram-negative rods. One blood culture is GPC's, likely contaminant. She was started on vancomycin for this. I will continue this until final altered reported.   3. History of CAD and stent: Patient was seen and evaluated by cardiology. At this time her blood pressure and heart rate are low to start medications. We will continue to monitor. When tolerated weaning try low-dose Coreg.   4. Diabetes : I will consult diabetes educator. She is currently in SSI   5. Depression: Patient is supposed to be on medications for depression. We are holding his medications due to her current mental status. She has been noncompliant with medications for several months. I have consulted psychiatry and discussed with Dr. Toni Amend.   6. Essential hypertension: Patient's blood pressure is low.. I will continue to monitor.  7. Acute renal failure: This may be due to  ATN secondary to hypoglycemia. She may have a component of prerenal azotemia as well and diabetic nephropathy. I am holding all nephrotoxic agents. She will need outpatient renal follow up.  8. Elevated INR: unclear etiology. She will need follow up with this as outpatient. Her LFTS were normal on admission  Physical therapy recommended skilled nursing facility. Patient and family does not want her to go to skilled nursing facility. She will be ready for discharge tomorrow with home health care..   Management plans discussed with the patient's son and patient who is at bedside and they are in agreement.  CODE STATUS: DO NOT RESUSCITATE  TOTAL TIME TAKING CARE OF THIS PATIENT: 31 minutes.   > 50 % time spent in care and coordination  POSSIBLE D/C IN 2-3 DAYS, DEPENDING ON CLINICAL CONDITION.   Skylah Delauter M.D on 07/08/2014 at 12:20 PM  Between 7am to 6pm - Pager - [phone] After 6pm go to [email or web address] - password [email or web address] [place]  [facility]  Office  [phone]  CC: Primary  care physician; Mila Merry, MD

## 2014-07-08 NOTE — Progress Notes (Signed)
Patient more alert and oriented x 2 ,  able to say her name,  date of birth and also make her needs known to staff. No hypoglycemic episode this shift. 10% dextrose still running at 100 mL/hr. Son at bedside, no issues.

## 2014-07-08 NOTE — Consult Note (Signed)
[facility] Face-to-Face Psychiatry Consult   Reason for Consult:  Consult for this 74 year old woman with confusion and altered mental status. Concern about psychiatric treatment. Referring Physician:  Mody Patient Identification: Katie Travis MRN:  [id number] Principal Diagnosis: SIRS (systemic inflammatory response syndrome) Diagnosis:   Patient Active Problem List   Diagnosis Date Noted  . Acute delirium [R41.0] 07/08/2014  . Dementia [F03.90] 07/08/2014  . SIRS (systemic inflammatory response syndrome) [A41.9] 07/06/2014  . Acute UTI [N39.0] 07/06/2014  . Altered mental state [R41.82] 07/06/2014  . Hypoglycemia [E16.2] 07/06/2014  . 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 female patient admitted with "I like hamburgers". This is a consult for this 74 year old woman who presented with altered mental status and confusion. Unclear past psychiatric history. Unclear what kind of psychiatric treatment she needs going forward. Information obtained from the patient and from her son.Marland Kitchen  HPI: Patient history obtained from the patient's son and the patient and review of the chart. This 74 year old woman presented with confusion. She was found to have an acute urinary tract infection and sepsis. Had altered mental status. Son reports that at baseline she is able to hold a reasonable conversation and is fairly lucid. He does say that he thinks that she is depressed because that time she will ruminate about traumatic things that happened to her in her youth. He denies that she has had any suicidal statements however. He had recently taken her to see his outpatient psychiatrist who had apparently prescribed some medicine for her area neither one of them know what it was. The patient herself is unable to give any history. She is extremely confused and delirious. It looks like her mental state has gotten  better since she was brought into the hospital but still is far off baseline. Concern was raised about the patient being paranoid. The son tells me that she won't take her medicines at home because she believes that her doctors are against her and that they are all conspiring. The patient cannot describe any of her medical problems to me or give me any information that she understands her medical problems.  Past psychiatric history: No past history of psychiatric hospitalization. No history of suicide attempts. No history of violence. Recently had been prescribed some kind of psychiatric medicine although neither of them know what it was. Patient gives a history of having a lot of traumatic experiences in her youth which has apparently been a long-standing issue for her.  Social history: Patient has 3 sons. She lives with one of them. She has another son who has been living in [place] who reportedly is coming to stay with her to help take care of her at home. Patient was born in [place] moved to the [place] after marrying a soldier.  Medical history: Acute urinary tract infection with sepsis. Hypoglycemia and a history of diabetes. History of arteriosclerotic cardiovascular disease unclear whether she's had a specific stroke.  Substance abuse history: No history of alcohol or drug abuse  Family history: The son who lives with her reports that he has "depression". Sounds like he his chronic psychiatric problems himself. HPI Elements:   Quality:  Confusion and delirium. Severity:  Fairly severe.. Timing:  Sounds like it's been getting worse for the last couple weeks. Duration:  Starting to get better over the last few days. Context:  Acute urinary tract infection.  Past  Medical History:  Past Medical History  Diagnosis Date  . Coronary artery disease   . Diabetes mellitus without complication   . Hypertension   . Depression    History reviewed. No pertinent past surgical history. Family  History: History reviewed. No pertinent family history. Social History:  History  Alcohol Use: Not on file     History  Drug Use Not on file    History   Social History  . Marital Status: Widowed    Spouse Name: N/A  . Number of Children: N/A  . Years of Education: N/A   Social History Main Topics  . Smoking status: Former Research scientist (life sciences)  . Smokeless tobacco: Not on file  . Alcohol Use: Not on file  . Drug Use: Not on file  . Sexual Activity: Not on file   Other Topics Concern  . None   Social History Narrative  . None   Additional Social History:                          Allergies:  No Known Allergies  Labs:  Results for orders placed or performed during the hospital encounter of 07/06/14 (from the past 48 hour(s))  Glucose, capillary     Status: None   Collection Time: 07/06/14  5:41 PM  Result Value Ref Range   Glucose-Capillary 80 65 - 99 mg/dL  Glucose, capillary     Status: None   Collection Time: 07/06/14  5:51 PM  Result Value Ref Range   Glucose-Capillary 70 65 - 99 mg/dL  Comprehensive metabolic panel     Status: Abnormal   Collection Time: 07/06/14  5:56 PM  Result Value Ref Range   Sodium 136 135 - 145 mmol/L   Potassium 5.3 (H) 3.5 - 5.1 mmol/L   Chloride 105 101 - 111 mmol/L   CO2 24 22 - 32 mmol/L   Glucose, Bld 76 65 - 99 mg/dL   BUN 31 (H) 6 - 20 mg/dL   Creatinine, Ser 2.48 (H) 0.44 - 1.00 mg/dL   Calcium 8.1 (L) 8.9 - 10.3 mg/dL   Total Protein 6.7 6.5 - 8.1 g/dL   Albumin 2.1 (L) 3.5 - 5.0 g/dL   AST 43 (H) 15 - 41 U/L   ALT 17 14 - 54 U/L   Alkaline Phosphatase 124 38 - 126 U/L   Total Bilirubin 2.9 (H) 0.3 - 1.2 mg/dL   GFR calc non Af Amer 18 (L) >60 mL/min   GFR calc Af Amer 21 (L) >60 mL/min    Comment: (NOTE) The eGFR has been calculated using the CKD EPI equation. This calculation has not been validated in all clinical situations. eGFR's persistently <60 mL/min signify possible Chronic Kidney Disease.    Anion gap 7 5 -  15  CBC with Differential     Status: Abnormal   Collection Time: 07/06/14  5:56 PM  Result Value Ref Range   WBC 11.0 3.6 - 11.0 K/uL   RBC 3.92 3.80 - 5.20 MIL/uL   Hemoglobin 9.4 (L) 12.0 - 16.0 g/dL   HCT 29.8 (L) 35.0 - 47.0 %   MCV 75.9 (L) 80.0 - 100.0 fL   MCH 24.0 (L) 26.0 - 34.0 pg   MCHC 31.6 (L) 32.0 - 36.0 g/dL   RDW 24.6 (H) 11.5 - 14.5 %   Platelets 199 150 - 440 K/uL   Neutrophils Relative % 81 %   Neutro Abs 8.9 (H) 1.4 - 6.5  K/uL   Lymphocytes Relative 15 %   Lymphs Abs 1.6 1.0 - 3.6 K/uL   Monocytes Relative 4 %   Monocytes Absolute 0.5 0.2 - 0.9 K/uL   Eosinophils Relative 0 %   Eosinophils Absolute 0.0 0 - 0.7 K/uL   Basophils Relative 0 %   Basophils Absolute 0.0 0 - 0.1 K/uL  Protime-INR     Status: Abnormal   Collection Time: 07/06/14  5:56 PM  Result Value Ref Range   Prothrombin Time 20.2 (H) 11.4 - 15.0 seconds   INR 1.70   Troponin I     Status: None   Collection Time: 07/06/14  5:56 PM  Result Value Ref Range   Troponin I <0.03 <0.031 ng/mL    Comment:        NO INDICATION OF MYOCARDIAL INJURY.   Culture, blood (routine x 2)     Status: None (Preliminary result)   Collection Time: 07/06/14  5:56 PM  Result Value Ref Range   Specimen Description BLOOD    Special Requests NONE    Culture  Setup Time      GRAM POSITIVE COCCI AEROBIC BOTTLE ONLY CRITICAL RESULT CALLED TO, READ BACK BY AND VERIFIED WITH:  CHARLOTTE KEY AT 3419 ON 07/07/14. CAF CONFIRMED BY TSH    Culture      COAGULASE NEGATIVE STAPHYLOCOCCUS AEROBIC BOTTLE ONLY POSSIBLE CONTAMINATION WITH SKIN FLORA    Report Status PENDING   Culture, blood (routine x 2)     Status: None (Preliminary result)   Collection Time: 07/06/14  5:56 PM  Result Value Ref Range   Specimen Description BLOOD    Special Requests NONE    Culture NO GROWTH 2 DAYS    Report Status PENDING   Urinalysis complete, with microscopic     Status: Abnormal   Collection Time: 07/06/14  6:04 PM  Result  Value Ref Range   Color, Urine AMBER (A) YELLOW   APPearance CLOUDY (A) CLEAR   Glucose, UA NEGATIVE NEGATIVE mg/dL   Bilirubin Urine NEGATIVE NEGATIVE   Ketones, ur NEGATIVE NEGATIVE mg/dL   Specific Gravity, Urine 1.015 1.005 - 1.030   Hgb urine dipstick NEGATIVE NEGATIVE   pH 5.0 5.0 - 8.0   Protein, ur 30 (A) NEGATIVE mg/dL   Nitrite NEGATIVE NEGATIVE   Leukocytes, UA 2+ (A) NEGATIVE   RBC / HPF 6-30 0 - 5 RBC/hpf   WBC, UA TOO NUMEROUS TO COUNT 0 - 5 WBC/hpf   Bacteria, UA MANY (A) NONE SEEN   Squamous Epithelial / LPF 6-30 (A) NONE SEEN   WBC Clumps PRESENT    Mucous PRESENT    Hyaline Casts, UA PRESENT   Urine culture     Status: None (Preliminary result)   Collection Time: 07/06/14  6:04 PM  Result Value Ref Range   Specimen Description URINE, CATHETERIZED    Special Requests NONE    Culture      >=100,000 COLONIES/mL GRAM NEGATIVE RODS IDENTIFICATION AND SUSCEPTIBILITIES TO FOLLOW    Report Status PENDING   Blood gas, venous     Status: Abnormal   Collection Time: 07/06/14  6:15 PM  Result Value Ref Range   pH, Ven 7.43 7.320 - 7.430   pCO2, Ven 38 (L) 44.0 - 60.0 mmHg   Bicarbonate 25.2 21.0 - 28.0 mEq/L   Acid-Base Excess 1.0 0.0 - 3.0 mmol/L   Patient temperature 37.0    Sample type VENOUS   Glucose, capillary     Status: Abnormal  Collection Time: 07/06/14  6:41 PM  Result Value Ref Range   Glucose-Capillary 130 (H) 65 - 99 mg/dL  Glucose, capillary     Status: Abnormal   Collection Time: 07/06/14  8:12 PM  Result Value Ref Range   Glucose-Capillary 102 (H) 65 - 99 mg/dL  Troponin I     Status: None   Collection Time: 07/06/14 10:36 PM  Result Value Ref Range   Troponin I <0.03 <0.031 ng/mL    Comment:        NO INDICATION OF MYOCARDIAL INJURY.   Glucose, capillary     Status: Abnormal   Collection Time: 07/07/14 12:19 AM  Result Value Ref Range   Glucose-Capillary 58 (L) 65 - 99 mg/dL  Glucose, capillary     Status: Abnormal   Collection Time:  07/07/14 12:56 AM  Result Value Ref Range   Glucose-Capillary 125 (H) 65 - 99 mg/dL  Troponin I     Status: None   Collection Time: 07/07/14  2:37 AM  Result Value Ref Range   Troponin I <0.03 <0.031 ng/mL    Comment:        NO INDICATION OF MYOCARDIAL INJURY.   Glucose, capillary     Status: Abnormal   Collection Time: 07/07/14  4:52 AM  Result Value Ref Range   Glucose-Capillary 111 (H) 65 - 99 mg/dL  Glucose, capillary     Status: Abnormal   Collection Time: 07/07/14  7:29 AM  Result Value Ref Range   Glucose-Capillary 63 (L) 65 - 99 mg/dL   Comment 1 Notify RN   Troponin I     Status: None   Collection Time: 07/07/14  8:26 AM  Result Value Ref Range   Troponin I <0.03 <0.031 ng/mL    Comment:        NO INDICATION OF MYOCARDIAL INJURY.   Comprehensive metabolic panel     Status: Abnormal   Collection Time: 07/07/14  8:26 AM  Result Value Ref Range   Sodium 137 135 - 145 mmol/L   Potassium 4.4 3.5 - 5.1 mmol/L   Chloride 108 101 - 111 mmol/L   CO2 25 22 - 32 mmol/L   Glucose, Bld 117 (H) 65 - 99 mg/dL   BUN 37 (H) 6 - 20 mg/dL   Creatinine, Ser 2.31 (H) 0.44 - 1.00 mg/dL   Calcium 7.5 (L) 8.9 - 10.3 mg/dL   Total Protein 5.5 (L) 6.5 - 8.1 g/dL   Albumin 1.6 (L) 3.5 - 5.0 g/dL   AST 33 15 - 41 U/L   ALT 14 14 - 54 U/L   Alkaline Phosphatase 90 38 - 126 U/L   Total Bilirubin 1.9 (H) 0.3 - 1.2 mg/dL   GFR calc non Af Amer 20 (L) >60 mL/min   GFR calc Af Amer 23 (L) >60 mL/min    Comment: (NOTE) The eGFR has been calculated using the CKD EPI equation. This calculation has not been validated in all clinical situations. eGFR's persistently <60 mL/min signify possible Chronic Kidney Disease.    Anion gap 4 (L) 5 - 15  Protime-INR     Status: Abnormal   Collection Time: 07/07/14  8:26 AM  Result Value Ref Range   Prothrombin Time 23.1 (H) 11.4 - 15.0 seconds   INR 2.03   Glucose, capillary     Status: None   Collection Time: 07/07/14  9:04 AM  Result Value Ref  Range   Glucose-Capillary 92 65 - 99 mg/dL   Comment  1 Notify RN   CBC     Status: Abnormal   Collection Time: 07/07/14  9:31 AM  Result Value Ref Range   WBC 7.5 3.6 - 11.0 K/uL   RBC 3.45 (L) 3.80 - 5.20 MIL/uL   Hemoglobin 8.5 (L) 12.0 - 16.0 g/dL   HCT 26.1 (L) 35.0 - 47.0 %   MCV 75.7 (L) 80.0 - 100.0 fL   MCH 24.6 (L) 26.0 - 34.0 pg   MCHC 32.5 32.0 - 36.0 g/dL   RDW 24.2 (H) 11.5 - 14.5 %   Platelets 131 (L) 150 - 440 K/uL  Glucose, capillary     Status: Abnormal   Collection Time: 07/07/14 11:44 AM  Result Value Ref Range   Glucose-Capillary 53 (L) 65 - 99 mg/dL   Comment 1 Notify RN   Protime-INR     Status: Abnormal   Collection Time: 07/07/14  1:17 PM  Result Value Ref Range   Prothrombin Time 22.3 (H) 11.4 - 15.0 seconds   INR 1.94   Glucose, capillary     Status: None   Collection Time: 07/07/14  1:20 PM  Result Value Ref Range   Glucose-Capillary 96 65 - 99 mg/dL   Comment 1 Notify RN   Glucose, capillary     Status: None   Collection Time: 07/07/14  2:58 PM  Result Value Ref Range   Glucose-Capillary 94 65 - 99 mg/dL   Comment 1 Notify RN   Glucose, capillary     Status: None   Collection Time: 07/07/14  4:58 PM  Result Value Ref Range   Glucose-Capillary 75 65 - 99 mg/dL  Glucose, capillary     Status: Abnormal   Collection Time: 07/07/14  7:48 PM  Result Value Ref Range   Glucose-Capillary 110 (H) 65 - 99 mg/dL  Glucose, capillary     Status: Abnormal   Collection Time: 07/07/14 10:11 PM  Result Value Ref Range   Glucose-Capillary 119 (H) 65 - 99 mg/dL  Glucose, capillary     Status: Abnormal   Collection Time: 07/08/14 12:06 AM  Result Value Ref Range   Glucose-Capillary 149 (H) 65 - 99 mg/dL  Glucose, capillary     Status: Abnormal   Collection Time: 07/08/14  1:58 AM  Result Value Ref Range   Glucose-Capillary 142 (H) 65 - 99 mg/dL  Glucose, capillary     Status: Abnormal   Collection Time: 07/08/14  4:10 AM  Result Value Ref Range    Glucose-Capillary 145 (H) 65 - 99 mg/dL  Basic metabolic panel     Status: Abnormal   Collection Time: 07/08/14  5:10 AM  Result Value Ref Range   Sodium 134 (L) 135 - 145 mmol/L   Potassium 4.2 3.5 - 5.1 mmol/L   Chloride 105 101 - 111 mmol/L   CO2 24 22 - 32 mmol/L   Glucose, Bld 154 (H) 65 - 99 mg/dL   BUN 35 (H) 6 - 20 mg/dL   Creatinine, Ser 2.21 (H) 0.44 - 1.00 mg/dL   Calcium 7.3 (L) 8.9 - 10.3 mg/dL   GFR calc non Af Amer 21 (L) >60 mL/min   GFR calc Af Amer 24 (L) >60 mL/min    Comment: (NOTE) The eGFR has been calculated using the CKD EPI equation. This calculation has not been validated in all clinical situations. eGFR's persistently <60 mL/min signify possible Chronic Kidney Disease.    Anion gap 5 5 - 15  Protime-INR     Status:  Abnormal   Collection Time: 07/08/14  5:10 AM  Result Value Ref Range   Prothrombin Time 23.6 (H) 11.4 - 15.0 seconds   INR 2.09   Glucose, capillary     Status: Abnormal   Collection Time: 07/08/14  5:57 AM  Result Value Ref Range   Glucose-Capillary 155 (H) 65 - 99 mg/dL  Glucose, capillary     Status: Abnormal   Collection Time: 07/08/14  7:29 AM  Result Value Ref Range   Glucose-Capillary 143 (H) 65 - 99 mg/dL   Comment 1 Notify RN   Glucose, capillary     Status: Abnormal   Collection Time: 07/08/14 10:04 AM  Result Value Ref Range   Glucose-Capillary 150 (H) 65 - 99 mg/dL   Comment 1 Notify RN     Vitals: Blood pressure 103/54, pulse 69, temperature 98.3 F (36.8 C), temperature source Oral, resp. rate 16, height _0  (1.727 m), weight 97.614 kg (215 lb 3.2 oz), SpO2 100 %.  Risk to Self: Is patient at risk for suicide?: No Risk to Others:   Prior Inpatient Therapy:   Prior Outpatient Therapy:    Current Facility-Administered Medications  Medication Dose Route Frequency Provider Last Rate Last Dose  . acetaminophen (TYLENOL) tablet 650 mg  650 mg Oral Q6H PRN Idelle Crouch, MD       Or  . acetaminophen (TYLENOL)  suppository 650 mg  650 mg Rectal Q6H PRN Idelle Crouch, MD   650 mg at 07/07/14 2340  . antiseptic oral rinse (CPC / CETYLPYRIDINIUM CHLORIDE 0.05%) solution 7 mL  7 mL Mouth Rinse BID Max Sane, MD   7 mL at 07/08/14 0851  . aspirin EC tablet 81 mg  81 mg Oral Daily Idelle Crouch, MD   81 mg at 07/08/14 0901  . bisacodyl (DULCOLAX) suppository 10 mg  10 mg Rectal Daily PRN Idelle Crouch, MD      . cefTRIAXone (ROCEPHIN) 1 g in dextrose 5 % 50 mL IVPB - Premix  1 g Intravenous Q24H Idelle Crouch, MD   1 g at 07/08/14 0901  . dextrose 50 % solution 50 mL  1 ampule Intravenous PRN Bettey Costa, MD   50 mL at 07/07/14 1748  . docusate sodium (COLACE) capsule 100 mg  100 mg Oral BID Idelle Crouch, MD   100 mg at 07/08/14 0901  . famotidine (PEPCID) IVPB 20 mg premix  20 mg Intravenous Q12H Idelle Crouch, MD   20 mg at 07/08/14 0901  . furosemide (LASIX) injection 40 mg  40 mg Intravenous BID Sital Mody, MD      . insulin aspart (novoLOG) injection 0-5 Units  0-5 Units Subcutaneous QHS Sital Mody, MD      . insulin aspart (novoLOG) injection 0-9 Units  0-9 Units Subcutaneous TID WC Sital Mody, MD      . ipratropium-albuterol (DUONEB) 0.5-2.5 (3) MG/3ML nebulizer solution 3 mL  3 mL Nebulization QID Idelle Crouch, MD   3 mL at 07/08/14 1128  . morphine 2 MG/ML injection 2 mg  2 mg Intravenous Q2H PRN Idelle Crouch, MD      . ondansetron [facility]) tablet 4 mg  4 mg Oral Q6H PRN Idelle Crouch, MD       Or  . ondansetron [facility]) injection 4 mg  4 mg Intravenous Q6H PRN Idelle Crouch, MD      . Derrill Memo ON 07/09/2014] vancomycin (VANCOCIN) IVPB 1000 mg/200 mL premix  1,000 mg Intravenous Q36H Bettey Costa, MD        Musculoskeletal: Strength & Muscle Tone: decreased Gait & Station: unable to stand Patient leans: N/A  Psychiatric Specialty Exam: Physical Exam  Constitutional: She appears well-nourished.  HENT:  Head: Normocephalic and atraumatic.  Eyes: Conjunctivae are  normal. Pupils are equal, round, and reactive to light.  Neck: Normal range of motion.  Respiratory: She is in respiratory distress.  GI: Soft.  Musculoskeletal: Normal range of motion.  Neurological: She is alert.  Skin: Skin is warm. She is diaphoretic.  Psychiatric: Thought content normal. Her mood appears anxious. Her affect is blunt. Her speech is delayed and slurred. She is slowed. Cognition and memory are impaired. She expresses inappropriate judgment. She exhibits abnormal recent memory.  Patient appears to be quite confused. Unable to sustain much history giving. No evidence of suicidality. Affect appears more blunted than it does depressed.    Review of Systems  Constitutional: Positive for malaise/fatigue.  HENT: Negative.   Eyes: Negative.   Respiratory: Positive for shortness of breath.   Cardiovascular: Negative.   Gastrointestinal: Negative.   Musculoskeletal: Negative.   Skin: Negative.   Neurological: Positive for focal weakness.  Psychiatric/Behavioral: Positive for memory loss. Negative for depression, suicidal ideas, hallucinations and substance abuse. The patient is nervous/anxious.     Blood pressure 103/54, pulse 69, temperature 98.3 F (36.8 C), temperature source Oral, resp. rate 16, height _0  (1.727 m), weight 97.614 kg (215 lb 3.2 oz), SpO2 100 %.Body mass index is 32.73 kg/(m^2).  General Appearance: Disheveled  Eye Contact::  Minimal  Speech:  Garbled, Slow and Slurred  Volume:  Decreased  Mood:  Anxious  Affect:  Blunt and Flat  Thought Process:  Disorganized and Irrelevant  Orientation:  Other:  Patient did not know where she was. She had no idea about the year or date although she was able to tell me that it was summer.  Thought Content:  Paranoid Ideation, Rumination and Patient admits that she has the belief that her doctors are trying to hurt her and conspiring against her. Admits that she has been noncompliant with medication at home. Very  confused in her thinking currently.  Suicidal Thoughts:  No  Homicidal Thoughts:  No  Memory:  Immediate;   Poor Recent;   Poor Remote;   Poor  Judgement:  Poor  Insight:  Lacking  Psychomotor Activity:  Decreased  Concentration:  Poor  Recall:  Poor  Fund of Knowledge:Fair  Language: Fair  Akathisia:  No  Handed:  Right  AIMS (if indicated):     Assets:  Social Support  ADL's:  Impaired  Cognition: WNL and Impaired,  Severe  Sleep:      Medical Decision Making: New problem, with additional work up planned, Review of Psycho-Social Stressors (1), Review or order clinical lab tests (1), Review and summation of old records (2) and Review of Medication Regimen & Side Effects (2)  Treatment Plan Summary: Plan This is a 74 year old woman with a past history of vascular dementia who is presenting as being acutely confused. In my interview with her today the patient was almost completely incoherent. I would ask her question several times in a row when she would answer with lists of foods that she liked to eat. Patient does admit that she doesn't trust doctors and doesn't want to take medicine which the son confirms. This suggests that at baseline there is a level of poor functioning that is imperiling her  health. I'm particularly concerned that her son who has been taking care of her has not forced the issue and has allowed her to skip her medicines. I have concerns about the long term viability of her living situation. Currently she clearly lacks any capacity to make any medical decisions. I am a little heartened that another son is coming to live with them. I would not order any medication at this time. Continue current medicines. I will follow-up daily. I'm going to suggest that social work look into whether this is going to be a safe situation for her when she goes home. No indication however right now for any antidepressives or other specific psychiatric medicine.  Plan:  Patient does not meet  criteria for psychiatric inpatient admission. Supportive therapy provided about ongoing stressors. Disposition: Continue monitoring in daily assessment on the inpatient unit. No need for commitment at this point.  John Clapacs 07/08/2014 12:48 PM

## 2014-07-08 NOTE — Progress Notes (Signed)
Physical Therapy Treatment Patient Details Name: Katie Travis MRN: [id number] DOB: [birth year] Today's Date: 07/08/2014    History of Present Illness Pt is a 74 year old female admitted to the hospital with weakness and shortness of breath.      PT Comments    Pt requires strong encouragement to participate with PT. Pt pleasant often laughing/making jokes, but notes she is "too old for this stuff" and when questioned if she would like to be able to help herself move and get out of bed at home, she responds "I don't have to, I have him, pointing to her son. Continue to progress strength for improved functional mobility with decreased assist if able.   Follow Up Recommendations  SNF     Equipment Recommendations  Hospital bed    Recommendations for Other Services       Precautions / Restrictions Precautions Precautions: Fall Restrictions Weight Bearing Restrictions: No    Mobility  Bed Mobility Overal bed mobility: Needs Assistance Bed Mobility: Supine to Sit     Supine to sit: Max assist     General bed mobility comments:  (Given cues/encouragement; pt initiates help then quits)  Transfers                    Ambulation/Gait             General Gait Details: Unable/not attempted..son notes pt has not ambulated in over 2 months   Stairs            Wheelchair Mobility    Modified Rankin (Stroke Patients Only)       Balance Overall balance assessment: Needs assistance Sitting-balance support: Bilateral upper extremity supported (inconsistent with self support) Sitting balance-Leahy Scale: Poor Sitting balance - Comments: Pt loses balance posteriorly and to the R; requires Min A to correct with cueing , pt does not initiate correction on her own.  Postural control: Posterior lean;Right lateral lean                          Cognition Arousal/Alertness: Awake/alert Behavior During Therapy: WFL for tasks assessed/performed Overall  Cognitive Status: History of cognitive impairments - at baseline                      Exercises General Exercises - Lower Extremity Ankle Circles/Pumps: AAROM;Both;20 reps;Supine Quad Sets:  (attempted; unable to follow command) Long Arc Quad: AAROM Heel Slides: Both;Seated;15 reps Hip ABduction/ADduction: AAROM;Both;15 reps;Supine    General Comments        Pertinent Vitals/Pain      Home Living                      Prior Function            PT Goals (current goals can now be found in the care plan section) Progress towards PT goals: Progressing toward goals (Slowly; requires strong encouragement to participate)    Frequency  Min 2X/week    PT Plan Current plan remains appropriate    Co-evaluation             End of Session   Activity Tolerance:  (Pt cognition limiting and self limiting) Patient left: in bed;with call bell/phone within reach;with bed alarm set;with family/visitor present;with nursing/sitter in room     Time: 8295-6213 PT Time Calculation (min) (ACUTE ONLY): 24 min  Charges:  $Therapeutic Exercise: 8-22 mins $Neuromuscular Re-education: 8-22 mins  G Codes:      Kristeen Miss 07/08/2014, 3:59 PM

## 2014-07-08 NOTE — Consult Note (Signed)
CC: AMS, aphasia   HPI: Katie Travis is an 74 y.o. female with past medical history significant for ASCVD, HTN, and DM who presents to the ER with lethargy and weakness x 1-2 days. Pt has been non-compliant at home and has not taken any medications in several months. Family states she has not gone to the doctor in almost a year. Information from son that is visiting from [place].  Pt found to have UTI which is being treated and hypoglycemia.  Mental status improving, speech improving.   Close to baseline according to son that is bedside.     Past Medical History  Diagnosis Date  . Coronary artery disease   . Diabetes mellitus without complication   . Hypertension   . Depression     History reviewed. No pertinent past surgical history.  History reviewed. No pertinent family history.  Social History:  reports that she has quit smoking. She does not have any smokeless tobacco history on file. Her alcohol and drug histories are not on file.  No Known Allergies  Medications: I have reviewed the patient's current medications.  ROS: Unable to obtain   Physical Examination: Blood pressure 103/54, pulse 69, temperature 98.3 F (36.8 C), temperature source Oral, resp. rate 16, height  (1.727 m), weight 97.614 kg (215 lb 3.2 oz), SpO2 100 %.  EOM intact Aphasic speech, but improving No facial assymetry Generalized weakness through out Diminished reflexes.    Laboratory Studies:   Basic Metabolic Panel:  Recent Labs Lab 07/06/14 1756 07/07/14 0826 07/08/14 0510  NA 136 137 134*  K 5.3* 4.4 4.2  CL 105 108 105  CO2 GLUCOSE 76 117* 154*  BUN 31* 37* 35*  CREATININE 2.48* 2.31* 2.21*  CALCIUM 8.1* 7.5* 7.3*    Liver Function Tests:  Recent Labs Lab 07/06/14 1756 07/07/14 0826  AST 43* 33  ALT 17 14  ALKPHOS 124 90  BILITOT 2.9* 1.9*  PROT 6.7 5.5*  ALBUMIN 2.1* 1.6*   No results for input(s): LIPASE, AMYLASE in the last 168 hours. No results for  input(s): AMMONIA in the last 168 hours.  CBC:  Recent Labs Lab 07/06/14 1756 07/07/14 0931  WBC 11.0 7.5  NEUTROABS 8.9*  --   HGB 9.4* 8.5*  HCT 29.8* 26.1*  MCV 75.9* 75.7*  PLT 199 131*    Cardiac Enzymes:  Recent Labs Lab 07/06/14 1756 07/06/14 2236 07/07/14 0237 07/07/14 0826  TROPONINI <0.03 <0.03 <0.03 <0.03    BNP: Invalid input(s): POCBNP  CBG:  Recent Labs Lab 07/08/14 0158 07/08/14 0410 07/08/14 0557 07/08/14 0729 07/08/14 1004  GLUCAP 142* 145* 155* 143* 150*    Microbiology: Results for orders placed or performed during the hospital encounter of 07/06/14  Culture, blood (routine x 2)     Status: None (Preliminary result)   Collection Time: 07/06/14  5:56 PM  Result Value Ref Range Status   Specimen Description BLOOD  Final   Special Requests NONE  Final   Culture  Setup Time   Final    GRAM POSITIVE COCCI AEROBIC BOTTLE ONLY CRITICAL RESULT CALLED TO, READ BACK BY AND VERIFIED WITH:  CHARLOTTE KEY AT 1746 ON 07/07/14. CAF CONFIRMED BY T[facility]    Culture   Final    COAGULASE NEGATIVE STAPHYLOCOCCUS AEROBIC BOTTLE ONLY POSSIBLE CONTAMINATION WITH SKIN FLORA    Report Status PENDING  Incomplete  Culture, blood (routine x 2)     Status: None (Preliminary result)  Collection Time: 07/06/14  5:56 PM  Result Value Ref Range Status   Specimen Description BLOOD  Final   Special Requests NONE  Final   Culture NO GROWTH 2 DAYS  Final   Report Status PENDING  Incomplete  Urine culture     Status: None (Preliminary result)   Collection Time: 07/06/14  6:04 PM  Result Value Ref Range Status   Specimen Description URINE, CATHETERIZED  Final   Special Requests NONE  Final   Culture   Final    >=100,000 COLONIES/mL GRAM NEGATIVE RODS IDENTIFICATION AND SUSCEPTIBILITIES TO FOLLOW    Report Status PENDING  Incomplete    Coagulation Studies:  Recent Labs  07/06/14 1756 07/07/14 0826 07/07/14 1317 07/08/14 0510  LABPROT 20.2* 23.1* 22.3*  23.6*  INR 1.70 2.03 1.94 2.09    Urinalysis:   Recent Labs Lab 07/06/14 1804  COLORURINE AMBER*  LABSPEC 1.015  PHURINE 5.0  GLUCOSEU NEGATIVE  HGBUR NEGATIVE  BILIRUBINUR NEGATIVE  KETONESUR NEGATIVE  PROTEINUR 30*  NITRITE NEGATIVE  LEUKOCYTESUR 2+*    Lipid Panel:  No results found for: CHOL, TRIG, HDL, CHOLHDL, VLDL, LDLCALC  HgbA1C: No results found for: HGBA1C  Urine Drug Screen:  No results found for: LABOPIA, COCAINSCRNUR, LABBENZ, AMPHETMU, THCU, LABBARB  Alcohol Level: No results for input(s): ETH in the last 168 hours.    Imaging: Dg Chest 1 View  07/08/2014   CLINICAL DATA:  74 year old female with systemic inflammatory response syndrome. Hypoxia. Initial encounter.  EXAM: CHEST  1 VIEW  COMPARISON:  07/06/2014 and earlier.  FINDINGS: Portable AP upright view at 0601 hours. Stable cardiac and mediastinal contours with mild if any cardiomegaly. Vague increased density in the medial right lung apex may be related to great vessels. No pneumothorax. Pulmonary vascularity is stable without overt edema. No pleural effusion or consolidation identified. No areas of worsening ventilation. Chronic left lateral rib fractures.  IMPRESSION: Stable.  No acute cardiopulmonary abnormality.   Electronically Signed   By: Odessa Fleming M.D.   On: 07/08/2014 07:43   Dg Chest 1 View  07/06/2014   CLINICAL DATA:  Altered mental status and unresponsive.  EXAM: CHEST  1 VIEW  COMPARISON:  12/26/2013 and prior radiographs  FINDINGS: Mild cardiomegaly noted.  Mild pulmonary vascular congestion is identified.  There is no evidence of focal airspace disease, pulmonary edema, suspicious pulmonary nodule/mass, pleural effusion, or pneumothorax. No acute bony abnormalities are identified.  Remote left-sided rib fractures again identified.  IMPRESSION: Mild cardiomegaly with mild pulmonary vascular congestion.   Electronically Signed   By: Harmon Pier M.D.   On: 07/06/2014 20:28   Ct Head Wo  Contrast  07/06/2014   CLINICAL DATA:  Hypoglycemia, unresponsive for most of the day  EXAM: CT HEAD WITHOUT CONTRAST  TECHNIQUE: Contiguous axial images were obtained from the base of the skull through the vertex without intravenous contrast.  COMPARISON:  10/29/2013  FINDINGS: There is no evidence of mass effect, midline shift, or extra-axial fluid collections. There is no evidence of a space-occupying lesion or intracranial hemorrhage. There is no evidence of a cortical-based area of acute infarction. 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 There is generalized cerebral atrophy. There is periventricular white matter low attenuation likely secondary to microangiopathy.  The ventricles and sulci are appropriate for the patient's age. The basal cisterns are patent.  Visualized portions of the orbits are unremarkable. The visualized portions of the paranasal sinuses and mastoid air cells are unremarkable. Cerebrovascular  atherosclerotic calcifications are noted.  The osseous structures are unremarkable.  IMPRESSION: 1. No acute intracranial pathology. 2. Chronic microvascular disease and cerebral atrophy.   Electronically Signed   By: Elige Ko   On: 07/06/2014 18:35     Assessment/Plan: 74 y.o. female with past medical history significant for ASCVD, HTN, and DM who presents to the ER with lethargy and weakness x 1-2 days. Pt has been non-compliant at home and has not taken any medications in several months. Family states she has not gone to the doctor in almost a year. Information from son that is visiting from [place].  Pt found to have UTI which is being treated and hypoglycemia.  Mental status improving, speech improving.   CTH no acute abnormalities  - pt is close to baseline according to the son that is bedside.  - no need for further imaging or EEG at this point.  - will s/off, call with questions.     Pauletta Browns   07/08/2014, 12:20 PM

## 2014-07-08 NOTE — Progress Notes (Signed)
[facility] Cardiology [facility] Encounter Note  Patient: Katie Travis / Admit Date: 07/06/2014 / Date of Encounter: 07/08/2014, 12:59 PM   Subjective: Patient is weak and fatigue with shortness of breath is slightly improved from yesterday  Review of Systems: Positive for: Lower extremity edema and shortness of breath with weakness Negative for: Vision change, hearing change, syncope, dizziness, nausea, vomiting,diarrhea, bloody stool, stomach pain, cough, congestion, diaphoresis, urinary frequency, urinary pain,skin lesions, skin rashes Others previously listed  Objective: Telemetry: Normal sinus rhythm Physical Exam: Blood pressure 103/54, pulse 69, temperature 98.3 F (36.8 C), temperature source Oral, resp. rate 16, height 5\' 8"  (1.727 m), weight 215 lb 3.2 oz (97.614 kg), SpO2 100 %. Body mass index is 32.73 kg/(m^2). General: Well developed, well nourished, in no acute distress. Head: Normocephalic, atraumatic, sclera non-icteric, no xanthomas, nares are without discharge. Neck: No apparent masses Lungs: Normal respirations with no wheezes, no rhonchi, no rales , basilar crackles   Heart: Regular rate and rhythm, normal S1 S2, no murmur, no rub, no gallop, PMI is normal size and placement, carotid upstroke normal without bruit, jugular venous pressure normal Abdomen: Soft, non-tender,  distended with normoactive bowel sounds. No hepatosplenomegaly. Abdominal aorta is normal size without bruit Extremities: 1+ edema, no clubbing, no cyanosis, no ulcers,  Peripheral: 2+ radial, 1 + femoral, 1 + dorsal pedal pulses Neuro: Alert and oriented. onds to questions appropriately with a normal affect.   Intake/Output Summary (Last 24 hours) at 07/08/14 1259 Last data filed at 07/08/14 1024  Gross per 24 hour  Intake   1870 ml  Output   2150 ml  Net   -280 ml    Inpatient Medications:  . antiseptic oral rinse  7 mL Mouth Rinse BID  . aspirin EC  81 mg Oral Daily  . cefTRIAXone  (ROCEPHIN)  IV  1 g Intravenous Q24H  . docusate sodium  100 mg Oral BID  . famotidine (PEPCID) IV  20 mg Intravenous Q12H  . furosemide  40 mg Intravenous BID  . insulin aspart  0-5 Units Subcutaneous QHS  . insulin aspart  0-9 Units Subcutaneous TID WC  . ipratropium-albuterol  3 mL Nebulization QID  . [START ON 07/09/2014] vancomycin  1,000 mg Intravenous Q36H   Infusions:    Labs:  Recent Labs  07/07/14 0826 07/08/14 0510  NA 137 134*  K 4.4 4.2  CL 108 105  CO2 25 24  GLUCOSE 117* 154*  BUN 37* 35*  CREATININE 2.31* 2.21*  CALCIUM 7.5* 7.3*    Recent Labs  07/06/14 1756 07/07/14 0826  AST 43* 33  ALT 17 14  ALKPHOS 124 90  BILITOT 2.9* 1.9*  PROT 6.7 5.5*  ALBUMIN 2.1* 1.6*    Recent Labs  07/06/14 1756 07/07/14 0931  WBC 11.0 7.5  NEUTROABS 8.9*  --   HGB 9.4* 8.5*  HCT 29.8* 26.1*  MCV 75.9* 75.7*  PLT 199 131*    Recent Labs  07/06/14 1756 07/06/14 2236 07/07/14 0237 07/07/14 0826  TROPONINI <0.03 <0.03 <0.03 <0.03   Invalid input(s): POCBNP No results for input(s): HGBA1C in the last 72 hours.   Weights: Filed Weights   07/06/14 2316 07/07/14 0607 07/08/14 0413  Weight: 208 lb 12.8 oz (94.711 kg) 209 lb 8 oz (95.029 kg) 215 lb 3.2 oz (97.614 kg)     Radiology/Studies:  Dg Chest 1 View  07/08/2014   CLINICAL DATA:  74 year old female with systemic inflammatory response syndrome. Hypoxia. Initial encounter.  EXAM:  CHEST  1 VIEW  COMPARISON:  07/06/2014 and earlier.  FINDINGS: Portable AP upright view at 0601 hours. Stable cardiac and mediastinal contours with mild if any cardiomegaly. Vague increased density in the medial right lung apex may be related to great vessels. No pneumothorax. Pulmonary vascularity is stable without overt edema. No pleural effusion or consolidation identified. No areas of worsening ventilation. Chronic left lateral rib fractures.  IMPRESSION: Stable.  No acute cardiopulmonary abnormality.   Electronically Signed    By: Odessa Fleming M.D.   On: 07/08/2014 07:43   Dg Chest 1 View  07/06/2014   CLINICAL DATA:  Altered mental status and unresponsive.  EXAM: CHEST  1 VIEW  COMPARISON:  12/26/2013 and prior radiographs  FINDINGS: Mild cardiomegaly noted.  Mild pulmonary vascular congestion is identified.  There is no evidence of focal airspace disease, pulmonary edema, suspicious pulmonary nodule/mass, pleural effusion, or pneumothorax. No acute bony abnormalities are identified.  Remote left-sided rib fractures again identified.  IMPRESSION: Mild cardiomegaly with mild pulmonary vascular congestion.   Electronically Signed   By: Harmon Pier M.D.   On: 07/06/2014 20:28   Ct Head Wo Contrast  07/06/2014   CLINICAL DATA:  Hypoglycemia, unresponsive for most of the day  EXAM: CT HEAD WITHOUT CONTRAST  TECHNIQUE: Contiguous axial images were obtained from the base of the skull through the vertex without intravenous contrast.  COMPARISON:  10/29/2013  FINDINGS: There is no evidence of mass effect, midline shift, or extra-axial fluid collections. There is no evidence of a space-occupying lesion or intracranial hemorrhage. There is no evidence of a cortical-based area of acute infarction. 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 There is generalized cerebral atrophy. There is periventricular white matter low attenuation likely secondary to microangiopathy.  The ventricles and sulci are appropriate for the patient's age. The basal cisterns are patent.  Visualized portions of the orbits are unremarkable. The visualized portions of the paranasal sinuses and mastoid air cells are unremarkable. Cerebrovascular atherosclerotic calcifications are noted.  The osseous structures are unremarkable.  IMPRESSION: 1. No acute intracranial pathology. 2. Chronic microvascular disease and cerebral atrophy.   Electronically Signed   By: Elige Ko   On: 07/06/2014 18:35     Assessment and Recommendation  74 y.o. female with acute on chronic  diastolic dysfunction congestive heart failure with known coronary artery disease essential hypertension exacerbated by chronic kidney disease stage IV and anemia with elevation of troponin consistent with demand ischemia rather than acute coronary syndrome 1. Aspirin for further risk reduction cardiovascular event and coronary artery disease 2. Furosemide intravenously and changed to oral furosemide if able tomorrow for diastolic dysfunction congestive heart failure 3. Antibiotics for any potential infection with inhalers and oxygenation as needed 4. In ambulation and follow for improvements of symptoms and other treatments of chronic kidney disease and anemia which is likely exacerbated above  Signed, Arnoldo Hooker M.D. FACC

## 2014-07-08 NOTE — Clinical Social Work Note (Signed)
Per RNCM pt will DC home.  CSW placement not needed

## 2014-07-08 NOTE — Progress Notes (Signed)
Pt alert and oriented x3, no complaints of pain or discomfort. Pt is very talkative this am.  Very responsive compared to yesterday.  Son at bedside.  Bed in low position, call bell within reach.  Bed alarms on and functioning.  Assessment done and charted.  Will continue to monitor and do hourly rounding throughout the shift

## 2014-07-08 NOTE — Progress Notes (Signed)
Pt pulled out IV.  Attempt x2,  Charge nurse attempted x2, no success.   Called the house supervisor and she will come to attempt to start an IV for the pt.

## 2014-07-08 NOTE — Care Management (Signed)
REferral to [facility] for hospital bed and lift.  Referral to [facility]  Patient suffers from  chf and has trouble breathing at night when head is elevated less  than 30  degrees. Bed wedges do not provide enough elevation to resolve breathing issues. Shortness of breath cause patient to require frequent changes in body position which cannot be achieved with a normal bed.

## 2014-07-09 ENCOUNTER — Inpatient Hospital Stay: Payer: Medicare Other

## 2014-07-09 ENCOUNTER — Encounter: Payer: Self-pay | Admitting: Internal Medicine

## 2014-07-09 DIAGNOSIS — N3000 Acute cystitis without hematuria: Secondary | ICD-10-CM | POA: Diagnosis not present

## 2014-07-09 LAB — PROTIME-INR
INR: 2.08
PROTHROMBIN TIME: 23.5 s — AB (ref 11.4–15.0)

## 2014-07-09 LAB — CBC
HCT: 24 % — ABNORMAL LOW (ref 35.0–47.0)
Hemoglobin: 7.6 g/dL — ABNORMAL LOW (ref 12.0–16.0)
MCH: 24.1 pg — ABNORMAL LOW (ref 26.0–34.0)
MCHC: 31.8 g/dL — ABNORMAL LOW (ref 32.0–36.0)
MCV: 75.8 fL — ABNORMAL LOW (ref 80.0–100.0)
PLATELETS: 113 10*3/uL — AB (ref 150–440)
RBC: 3.16 MIL/uL — AB (ref 3.80–5.20)
RDW: 23.5 % — AB (ref 11.5–14.5)
WBC: 3.7 10*3/uL (ref 3.6–11.0)

## 2014-07-09 LAB — GLUCOSE, CAPILLARY
GLUCOSE-CAPILLARY: 99 mg/dL (ref 65–99)
Glucose-Capillary: 89 mg/dL (ref 65–99)
Glucose-Capillary: 101 mg/dL — ABNORMAL HIGH (ref 65–99)

## 2014-07-09 LAB — BASIC METABOLIC PANEL
Anion gap: 5 (ref 5–15)
BUN: 29 mg/dL — AB (ref 6–20)
CHLORIDE: 106 mmol/L (ref 101–111)
CO2: 26 mmol/L (ref 22–32)
Calcium: 7.7 mg/dL — ABNORMAL LOW (ref 8.9–10.3)
Creatinine, Ser: 1.81 mg/dL — ABNORMAL HIGH (ref 0.44–1.00)
GFR calc Af Amer: 31 mL/min — ABNORMAL LOW (ref >60)
GFR calc non Af Amer: 26 mL/min — ABNORMAL LOW (ref >60)
Glucose, Bld: 112 mg/dL — ABNORMAL HIGH (ref 65–99)
Potassium: 3.6 mmol/L (ref 3.5–5.1)
SODIUM: 137 mmol/L (ref 135–145)

## 2014-07-09 MED ORDER — ASPIRIN 81 MG PO TBEC: 81.0000 mg | DELAYED_RELEASE_TABLET | Freq: Every day | ORAL | Status: AC

## 2014-07-09 MED ORDER — ONETOUCH ULTRASOFT LANCETS MISC: Status: AC

## 2014-07-09 MED ORDER — FUROSEMIDE 40 MG PO TABS: 40.0000 mg | ORAL_TABLET | Freq: Two times a day (BID) | ORAL | Status: DC

## 2014-07-09 MED ORDER — FERROUS SULFATE 325 (65 FE) MG PO TABS: 325.0000 mg | ORAL_TABLET | Freq: Every day | ORAL | Status: DC

## 2014-07-09 MED ORDER — CIPROFLOXACIN HCL 250 MG PO TABS: 250.0000 mg | ORAL_TABLET | Freq: Two times a day (BID) | ORAL | Status: DC

## 2014-07-09 MED ORDER — CIPROFLOXACIN HCL 250 MG PO TABS
250.0000 mg | ORAL_TABLET | Freq: Two times a day (BID) | ORAL | Status: DC
  Administered 2014-07-09: 250 mg via ORAL
  Filled 2014-07-09: qty 1

## 2014-07-09 MED ORDER — POTASSIUM CHLORIDE ER 10 MEQ PO TBCR: 10.0000 meq | EXTENDED_RELEASE_TABLET | Freq: Every day | ORAL | Status: AC

## 2014-07-09 NOTE — Consult Note (Signed)
  Psychiatry: Follow-up consult for this 74 year old woman with a history of dementia and delirium. Came by to see her this morning but she was asleep. I got to meet her other son the one who is moving here from [place]. He states that she still is intermittently confused when she wakes up area says that she is a little bit more lucid than she was yesterday but still goes off topic. He expresses an understanding of the importance of maintaining her medical care better than it has been recently.  Patient was asleep and I did not wake her up. Vital signs appear to be stable. Report from the son indicates this is a delirium and dementia are still present but perhaps improved.  I understand the plan is for discharge today. I would not add any psychiatric medicine. Patient needs follow-up monitoring by her outpatient doctor and psychoeducation was completed about the importance of taking care of all of her medical problems.  No change to diagnosis of acute delirium on top of multifactorial dementia

## 2014-07-09 NOTE — Progress Notes (Signed)
Pt removed IV. 6 attempts to restart IV was made in various locations by three nurses. Unsuccessful. MD notified. Ordered to get central line put in. MD aware pt has no current IV access.Marland Kitchen

## 2014-07-09 NOTE — Progress Notes (Signed)
Pt discharged to home.  Pt's son voiced understanding.  No questions or concerns.  Tele removed.  No IV in place at this time to remove.  Pt left by EMS, case management notified of discharge.  Cristela Felt, RN

## 2014-07-09 NOTE — Care Management (Addendum)
For for discharge home today.  Will require ems transport.  [facility] informed and will see within 24 hours of discharge Hospital bed and lift have been delivered to home.  No need for BSC.

## 2014-07-09 NOTE — Progress Notes (Signed)
[facility] Encounter Note  Patient: Katie Travis / Admit Date: 07/06/2014 / Date of Encounter: 07/09/2014, 1:00 PM   Subjective: Patient is weak and fatigue with shortness of breath but with significant improvement since admission  Review of Systems: Positive for: Lower extremity edema and shortness of breath with weakness Negative for: Vision change, hearing change, syncope, dizziness, nausea, vomiting,diarrhea, bloody stool, stomach pain, cough, congestion, diaphoresis, urinary frequency, urinary pain,skin lesions, skin rashes Others previously listed  Objective: Telemetry: Normal sinus rhythm Physical Exam: Blood pressure 138/52, pulse 79, temperature 98.3 F (36.8 C), temperature source Oral, resp. rate 16, height 5\' 8"  (1.727 m), weight 208 lb 8 oz (94.575 kg), SpO2 100 %. Body mass index is 31.71 kg/(m^2). General: Well developed, well nourished, in no acute distress. Head: Normocephalic, atraumatic, sclera non-icteric, no xanthomas, nares are without discharge. Neck: No apparent masses Lungs: Normal respirations with no wheezes, no rhonchi, no rales , basilar crackles   Heart: Regular rate and rhythm, normal S1 S2, no murmur, no rub, no gallop, PMI is normal size and placement, carotid upstroke normal without bruit, jugular venous pressure normal Abdomen: Soft, non-tender,  distended with normoactive bowel sounds. No hepatosplenomegaly. Abdominal aorta is normal size without bruit Extremities: 1+ edema, no clubbing, no cyanosis, no ulcers,  Peripheral: 2+ radial, 1 + femoral, 1 + dorsal pedal pulses Neuro: Alert and oriented. onds to questions appropriately with a normal affect.   Intake/Output Summary (Last 24 hours) at 07/09/14 1300 Last data filed at 07/09/14 1215  Gross per 24 hour  Intake      0 ml  Output   1000 ml  Net  -1000 ml    Inpatient Medications:  . antiseptic oral rinse  7 mL Mouth Rinse BID  . aspirin EC  81 mg Oral Daily  .  ciprofloxacin  250 mg Oral BID  . docusate sodium  100 mg Oral BID  . furosemide  40 mg Intravenous BID  . insulin aspart  0-5 Units Subcutaneous QHS  . insulin aspart  0-9 Units Subcutaneous TID WC  . ipratropium-albuterol  3 mL Nebulization QID   Infusions:    Labs:  Recent Labs  07/08/14 0510 07/09/14 0409  NA 134* 137  K 4.2 3.6  CL 105 106  CO2 24 26  GLUCOSE 154* 112*  BUN 35* 29*  CREATININE 2.21* 1.81*  CALCIUM 7.3* 7.7*    Recent Labs  07/06/14 1756 07/07/14 0826  AST 43* 33  ALT 17 14  ALKPHOS 124 90  BILITOT 2.9* 1.9*  PROT 6.7 5.5*  ALBUMIN 2.1* 1.6*    Recent Labs  07/06/14 1756 07/07/14 0931 07/09/14 0409  WBC 11.0 7.5 3.7  NEUTROABS 8.9*  --   --   HGB 9.4* 8.5* 7.6*  HCT 29.8* 26.1* 24.0*  MCV 75.9* 75.7* 75.8*  PLT 199 131* 113*    Recent Labs  07/06/14 1756 07/06/14 2236 07/07/14 0237 07/07/14 0826  TROPONINI <0.03 <0.03 <0.03 <0.03   Invalid input(s): POCBNP  Recent Labs  07/08/14 0510  HGBA1C 4.6     Weights: Filed Weights   07/07/14 0607 07/08/14 0413 07/09/14 0426  Weight: 209 lb 8 oz (95.029 kg) 215 lb 3.2 oz (97.614 kg) 208 lb 8 oz (94.575 kg)     Radiology/Studies:  Dg Chest 1 View  07/08/2014   CLINICAL DATA:  74 year old female with systemic inflammatory response syndrome. Hypoxia. Initial encounter.  EXAM: CHEST  1 VIEW  COMPARISON:  07/06/2014 and earlier.  FINDINGS: Portable AP upright view at 0601 hours. Stable cardiac and mediastinal contours with mild if any cardiomegaly. Vague increased density in the medial right lung apex may be related to great vessels. No pneumothorax. Pulmonary vascularity is stable without overt edema. No pleural effusion or consolidation identified. No areas of worsening ventilation. Chronic left lateral rib fractures.  IMPRESSION: Stable.  No acute cardiopulmonary abnormality.   Electronically Signed   By: Odessa Fleming M.D.   On: 07/08/2014 07:43   Dg Chest 1 View  07/06/2014   CLINICAL  DATA:  Altered mental status and unresponsive.  EXAM: CHEST  1 VIEW  COMPARISON:  12/26/2013 and prior radiographs  FINDINGS: Mild cardiomegaly noted.  Mild pulmonary vascular congestion is identified.  There is no evidence of focal airspace disease, pulmonary edema, suspicious pulmonary nodule/mass, pleural effusion, or pneumothorax. No acute bony abnormalities are identified.  Remote left-sided rib fractures again identified.  IMPRESSION: Mild cardiomegaly with mild pulmonary vascular congestion.   Electronically Signed   By: Harmon Pier M.D.   On: 07/06/2014 20:28   Ct Head Wo Contrast  07/06/2014   CLINICAL DATA:  Hypoglycemia, unresponsive for most of the day  EXAM: CT HEAD WITHOUT CONTRAST  TECHNIQUE: Contiguous axial images were obtained from the base of the skull through the vertex without intravenous contrast.  COMPARISON:  10/29/2013  FINDINGS: There is no evidence of mass effect, midline shift, or extra-axial fluid collections. There is no evidence of a space-occupying lesion or intracranial hemorrhage. There is no evidence of a cortical-based area of acute infarction. 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 There is generalized cerebral atrophy. There is periventricular white matter low attenuation likely secondary to microangiopathy.  The ventricles and sulci are appropriate for the patient's age. The basal cisterns are patent.  Visualized portions of the orbits are unremarkable. The visualized portions of the paranasal sinuses and mastoid air cells are unremarkable. Cerebrovascular atherosclerotic calcifications are noted.  The osseous structures are unremarkable.  IMPRESSION: 1. No acute intracranial pathology. 2. Chronic microvascular disease and cerebral atrophy.   Electronically Signed   By: Elige Ko   On: 07/06/2014 18:35   US Abdomen Complete  07/09/2014   CLINICAL DATA:  Elevated INR.  History of diabetes.  EXAM: ULTRASOUND ABDOMEN COMPLETE  COMPARISON:  Ultrasound  10/03/2013 and CT 06/12/2010  FINDINGS: Gallbladder: Moderate cholelithiasis with the largest stone measuring 8 mm. Mild gallbladder wall thickening measuring 5 mm. Negative sonographic Murphy sign.  Common bile duct: Diameter: 3.5 mm.  Liver: Nodular contour with mild increased echogenicity suggesting steatosis/cirrhosis. Hepatopetal flow within the portal vein.  IVC: No abnormality visualized.  Pancreas: Visualized portion unremarkable.  Spleen: Borderline splenomegaly.  Right Kidney: Length: 10.7 cm. Echogenicity within normal limits. No mass or hydronephrosis visualized.  Left Kidney: Length: 11.1 cm. Echogenicity within normal limits. No mass or hydronephrosis visualized.  Abdominal aorta: No aneurysm visualized.  Other findings: Mild ascites.  IMPRESSION: Findings suggesting hepatic steatosis/cirrhosis with mild ascites and borderline splenomegaly.  Moderate cholelithiasis. Mild gallbladder wall thickening. Findings could be seen with acute cholecystitis although the gallbladder wall thickening may be secondary to patient's ascites.   Electronically Signed   By: Elberta Fortis M.D.   On: 07/09/2014 09:34     Assessment and Recommendation  74 y.o. female with acute on chronic diastolic dysfunction congestive heart failure with known coronary artery disease essential hypertension exacerbated by chronic kidney disease stage IV and anemia with elevation of troponin consistent with demand ischemia  rather than acute coronary syndrome now slowly improved 1. Aspirin for further risk reduction cardiovascular event and coronary artery disease without change today 2. Furosemide orally and continue as outpatient 3. Antibiotics for any potential infection with inhalers and oxygenation during rehabilitation 4. ambulation and follow for improvements of symptoms and other treatments of chronic kidney disease and anemia which is likely exacerbated above with the possible discharge to home and/or outside facility as  necessary with no other cardiac restrictions  Signed, Arnoldo Hooker M.D. FACC

## 2014-07-09 NOTE — Discharge Summary (Signed)
[facility] Physicians - [place] [facility]   PATIENT NAME: Katie Travis    MR#:  [id number]  DATE OF BIRTH:  November 25, 1940  DATE OF ADMISSION:  07/06/2014 ADMITTING PHYSICIAN: Delfino Lovett, MD  DATE OF DISCHARGE: 07/09/2014  PRIMARY CARE PHYSICIAN: Mila Merry, MD    ADMISSION DIAGNOSIS:  SOB (shortness of breath) [R06.02]  DISCHARGE DIAGNOSIS:  Principal Problem:   SIRS (systemic inflammatory response syndrome) Active Problems:   Acute UTI   Altered mental state   Hypoglycemia   ASCVD (arteriosclerotic cardiovascular disease)   Acute delirium   Dementia   SECONDARY DIAGNOSIS:  Diastolic heart failure  HOSPITAL COURSE:  This is a 74 year old female with a past medical history significant for diastolic heart failure who stopped taking medications over 4-5 months ago and presented with hypoglycemia. It appeared from her medications that she may have been a diabetic and may have had high blood pressure in the past. For further details please further H&P.    1. Altered mental status/disorientation: Patient has underlying vascular dementia. She underwent a CT scan of the head which was negative. Her altered mental status was likely secondary to urinary tract infection and hypoglycemia. Patient appears to be at her baseline. Patient was seen and evaluated by neurology as well as psychiatry. As per psychiatry consultation it does appear the patient has dementia which limits her cognitive status. Family says that she is now at her baseline.   2. Acute cystitis without hematuria: Her urine cultures positive for Escherichia coli. Patient will be discharged with ciprofloxacin.   3. Acute renal failure due to prerenal azotemia from acute on chronic diastolic heart failure.: Patient's creatinine has improved. I suspect that she has chronic renal failure. She will have follow-up with Dr. Thedore Mins for further evaluation. She underwent a come plead abdominal ultrasound which also  visualizes the kidneys. There is no evidence of hydronephrosis. She should continue to avoid nephrotoxic agents. Her creatinine has improved with diuresis.  4. Acute on chronic diastolic heart failure: Patient continues to have some lower extremity edema. Patient does need oxygen at discharge which GERD has. Patient was diuresed with Lasix IV. Patient will need a follow-up with cardiology further evaluation and management. Patient had echocardiogram during this consultation with did show a normal ejection fraction. Patient was also seen and evaluated by cardiology while in the hospital. She will be discharged home with Lasix.  5. Elevated INR: Patient's abdominal ultrasound shows hepatic steatosis/cirrhosis with mild ascites. Patient will need GI follow-up.  6. Hypoglycemia: Hemoglobin A1c was 4.6. It is unclear if the patient has diabetes. Patient will need a glucometer and blood sugar monitoring as an outpatient. She was initially started on D10. This is now been discontinued over 48 hours and her blood sugars remained stable.   7. Dementia: As per psychiatry report it appears the patient has dementia. It is unclear if she has underlying depression or not. Psychiatry could not diagnosis. She will have psychiatry outpatient follow-up.  8. History of CAD and stent: Patient's blood pressure is low normal and therefore is unable to tolerate Coreg. She will continue with Coreg and due to her underlying cirrhosis and abnormal ultrasound I am holding statin medication at this time.  9. Anemia of chronic disease: Patient's hemoglobin did decrease likely secondary to dilutional effect. Patient had no signs or symptoms of GI bleed. Patient will need close monitoring for her anemia. I suspect her anemia is from chronic disease from her kidneys and heart.  DISCHARGE CONDITIONS AND DIET:  Patient was recommended by physical therapy to be discharged to skilled nursing facility, however patient's family wanted  to take the patient home. Patient will be discharged home with home health care.    renal and heart healthy diet.  Consults: Neurology Psychiatry Cardiology Diabetes coordinator  DRUG ALLERGIES:  No Known Allergies  DISCHARGE MEDICATIONS:   Current Discharge Medication List    START taking these medications   Details  aspirin EC 81 MG EC tablet Take 1 tablet (81 mg total) by mouth daily. Qty: 120 tablet, Refills: 0    ciprofloxacin (CIPRO) 250 MG tablet Take 1 tablet (250 mg total) by mouth 2 (two) times daily. Qty: 20 tablet, Refills: 0    furosemide (LASIX) 40 MG tablet Take 1 tablet (40 mg total) by mouth 2 (two) times daily. Qty: 30 tablet, Refills: 0    Lancets (ONETOUCH ULTRASOFT) lancets Use as instructed Qty: 100 each, Refills: 12    potassium chloride (K-DUR) 10 MEQ tablet Take 1 tablet (10 mEq total) by mouth daily. Qty: 30 tablet, Refills: 0       Ferrous Sulfate 325 mg BID    Today   CHIEF COMPLAINT:  Patient reports no symptoms. Patient appears to be at her baseline. She has minimal confusion.   VITAL SIGNS:  Blood pressure 138/52, pulse 79, temperature 98.3 F (36.8 C), temperature source Oral, resp. rate 16, height 5\' 8"  (1.727 m), weight 94.575 kg (208 lb 8 oz), SpO2 100 %.   REVIEW OF SYSTEMS:  Review of Systems  Constitutional: Negative for fever, chills and malaise/fatigue.  HENT: Negative for sore throat.   Eyes: Negative for pain.  Respiratory: Negative for cough, hemoptysis, shortness of breath and wheezing.   Cardiovascular: Positive for leg swelling. Negative for chest pain, palpitations and PND.  Gastrointestinal: Negative for nausea, vomiting, abdominal pain, diarrhea and blood in stool.  Genitourinary: Negative for dysuria.  Musculoskeletal: Negative for back pain.  Neurological: Negative for dizziness, tremors and headaches.  Endo/Heme/Allergies: Bruises/bleeds easily.  Psychiatric/Behavioral: Positive for memory loss.  Negative for suicidal ideas and hallucinations. The patient is not nervous/anxious.      PHYSICAL EXAMINATION:  GENERAL:  74 y.o.-year-old patient lying in the bed with no acute distress.  NECK:  Supple, no jugular venous distention. No thyroid enlargement, no tenderness.  LUNGS: Normal breath sounds bilaterally, no wheezing, rales,rhonchi  No use of accessory muscles of respiration.  CARDIOVASCULAR: S1, S2 normal. No murmurs, rubs, or gallops.  ABDOMEN: Soft, non-tender, non-distended. Bowel sounds present. No organomegaly or mass.  EXTREMITIES:  +2+ lower extremity edema. PSYCHIATRIC: Very strange affect. She is alert and oriented to name and place.   SKIN: No obvious rash, lesion, or ulcer.   DATA REVIEW:   CBC  Recent Labs Lab 07/09/14 0409  WBC 3.7  HGB 7.6*  HCT 24.0*  PLT 113*    Chemistries   Recent Labs Lab 07/07/14 0826  07/09/14 0409  NA 137  < > 137  K 4.4  < > 3.6  CL 108  < > 106  CO2 25  < > 26  GLUCOSE 117*  < > 112*  BUN 37*  < > 29*  CREATININE 2.31*  < > 1.81*  CALCIUM 7.5*  < > 7.7*  AST 33  --   --   ALT 14  --   --   ALKPHOS 90  --   --   BILITOT 1.9*  --   --   < > =  values in this interval not displayed.  Cardiac Enzymes  Recent Labs Lab 07/06/14 2236 07/07/14 0237 07/07/14 0826  TROPONINI <0.03 <0.03 <0.03    Microbiology Results  @  RADIOLOGY:  Dg Chest 1 View  07/08/2014   IMPRESSION: Stable.  No acute cardiopulmonary abnormality.   Electronically Signed   By: Odessa Fleming M.D.   On: 07/08/2014 07:43   US Abdomen Complete  07/09/2014   CLIMPRESSION: Findings suggesting hepatic steatosis/cirrhosis with mild ascites and borderline splenomegaly.  Moderate cholelithiasis. Mild gallbladder wall thickening. Findings could be seen with acute cholecystitis although the gallbladder wall thickening may be secondary to patient's ascites.   Electronically Signed   By: Elberta Fortis M.D.   On: 07/09/2014 09:34      Management  plans discussed with the patient's family and they are in agreement  Stable for discharge home with home health Patient should follow up PCP in one week CODE STATUS:     Code Status Orders        Start     Ordered   07/06/14 2059  Do not attempt resuscitation (DNR)   Continuous    Question Answer Comment  In the event of cardiac or respiratory ARREST Do not call a "code blue"   In the event of cardiac or respiratory ARREST Do not perform Intubation, CPR, defibrillation or ACLS   In the event of cardiac or respiratory ARREST Use medication by any route, position, wound care, and other measures to relive pain and suffering. May use oxygen, suction and manual treatment of airway obstruction as needed for comfort.      07/06/14 2059    Advance Directive Documentation        Most Recent Value   Type of Advance Directive  Living will   Pre-existing out of facility DNR order (yellow form or pink MOST form)     "MOST" Form in Place?        TOTAL TIME TAKING CARE OF THIS PATIENT: 40 minutes.    Melvinia Ashby M.D on 07/09/2014 at 11:26 AM  Between 7am to 6pm - Pager - [phone] After 6pm go to [email or web address] - password [email or web address] [facility]  [facility]  Office  [phone]  CC: Primary care physician; Mila Merry, MD

## 2014-07-09 NOTE — Discharge Instructions (Signed)
[facility]- AG[facility]Y WILL SEE WITHIN 24 HOURS OF DC- [phone]

## 2014-07-10 ENCOUNTER — Telehealth: Payer: Self-pay | Admitting: Family Medicine

## 2014-07-10 NOTE — Care Management (Addendum)
Received call from [place] with [facility].  At rest room air sat was 87.  At rest sat on 3.5 liter 98%.  Velna Hatchet then stated that she dicussed home hospice with family and they are in agreement.  Discussed that [facility] would need to coordinate this transfer and if this is the plan- this CM will not forward any 02 sats to [facility] or obtain the order- as it would not be necessary.

## 2014-07-10 NOTE — Care Management (Addendum)
Family members have called CM requesting smaller 02 tanks at home.  Has portable tanks but need smaller ones.  CM contacted [facility] and found that patient's current 02 order is for nocturnal only.  The "portable tanks" are essentially the back up tanks. Spoke with home health nurse Velna Hatchet that was actually in the home presently and instructed on how to obtain sats and to call and fax information to this CM.  If qualifies will obtain order for continuous 02 and fax information to [employer].

## 2014-07-10 NOTE — Telephone Encounter (Signed)
Silvio Pate with [facility] called stating pt will need a Hospice referral.  Silvio Pate will be calling Hospice today.  FV#[phone]/MB

## 2014-07-11 ENCOUNTER — Telehealth: Payer: Self-pay | Admitting: Family Medicine

## 2014-07-11 DIAGNOSIS — F0391 Unspecified dementia with behavioral disturbance: Secondary | ICD-10-CM

## 2014-07-11 LAB — CULTURE, BLOOD (ROUTINE X 2): CULTURE: NO GROWTH

## 2014-07-11 NOTE — Telephone Encounter (Deleted)
Katie Travis with [facility] called to request a Hospice referral.  CB#[phone]/MJ ° °

## 2014-07-11 NOTE — Telephone Encounter (Signed)
Debra with Hospice called to request a Hospice referral.  [phone]

## 2014-07-15 ENCOUNTER — Ambulatory Visit (INDEPENDENT_AMBULATORY_CARE_PROVIDER_SITE_OTHER): Payer: Medicare Other | Admitting: Family Medicine

## 2014-07-15 ENCOUNTER — Encounter: Payer: Self-pay | Admitting: *Deleted

## 2014-07-15 DIAGNOSIS — R410 Disorientation, unspecified: Secondary | ICD-10-CM

## 2014-07-15 DIAGNOSIS — R079 Chest pain, unspecified: Secondary | ICD-10-CM | POA: Insufficient documentation

## 2014-07-15 DIAGNOSIS — A419 Sepsis, unspecified organism: Secondary | ICD-10-CM

## 2014-07-15 DIAGNOSIS — R270 Ataxia, unspecified: Secondary | ICD-10-CM | POA: Insufficient documentation

## 2014-07-15 DIAGNOSIS — E162 Hypoglycemia, unspecified: Secondary | ICD-10-CM

## 2014-07-15 DIAGNOSIS — F419 Anxiety disorder, unspecified: Secondary | ICD-10-CM

## 2014-07-15 DIAGNOSIS — D649 Anemia, unspecified: Secondary | ICD-10-CM | POA: Diagnosis not present

## 2014-07-15 DIAGNOSIS — E119 Type 2 diabetes mellitus without complications: Secondary | ICD-10-CM | POA: Insufficient documentation

## 2014-07-15 DIAGNOSIS — R0789 Other chest pain: Secondary | ICD-10-CM | POA: Insufficient documentation

## 2014-07-15 DIAGNOSIS — F410 Panic disorder [episodic paroxysmal anxiety] without agoraphobia: Secondary | ICD-10-CM | POA: Insufficient documentation

## 2014-07-15 DIAGNOSIS — R4182 Altered mental status, unspecified: Secondary | ICD-10-CM | POA: Insufficient documentation

## 2014-07-15 DIAGNOSIS — F4489 Other dissociative and conversion disorders: Secondary | ICD-10-CM | POA: Insufficient documentation

## 2014-07-15 DIAGNOSIS — G4700 Insomnia, unspecified: Secondary | ICD-10-CM | POA: Insufficient documentation

## 2014-07-15 DIAGNOSIS — R0609 Other forms of dyspnea: Secondary | ICD-10-CM | POA: Insufficient documentation

## 2014-07-15 DIAGNOSIS — I10 Essential (primary) hypertension: Secondary | ICD-10-CM | POA: Insufficient documentation

## 2014-07-15 DIAGNOSIS — W19XXXA Unspecified fall, initial encounter: Secondary | ICD-10-CM | POA: Insufficient documentation

## 2014-07-15 DIAGNOSIS — I8500 Esophageal varices without bleeding: Secondary | ICD-10-CM | POA: Insufficient documentation

## 2014-07-15 DIAGNOSIS — F0390 Unspecified dementia without behavioral disturbance: Secondary | ICD-10-CM

## 2014-07-15 DIAGNOSIS — I2510 Atherosclerotic heart disease of native coronary artery without angina pectoris: Secondary | ICD-10-CM | POA: Insufficient documentation

## 2014-07-15 DIAGNOSIS — K769 Liver disease, unspecified: Secondary | ICD-10-CM | POA: Insufficient documentation

## 2014-07-15 DIAGNOSIS — S20219A Contusion of unspecified front wall of thorax, initial encounter: Secondary | ICD-10-CM | POA: Insufficient documentation

## 2014-07-15 DIAGNOSIS — E785 Hyperlipidemia, unspecified: Secondary | ICD-10-CM | POA: Insufficient documentation

## 2014-07-15 DIAGNOSIS — K219 Gastro-esophageal reflux disease without esophagitis: Secondary | ICD-10-CM | POA: Insufficient documentation

## 2014-07-15 DIAGNOSIS — Y92009 Unspecified place in unspecified non-institutional (private) residence as the place of occurrence of the external cause: Secondary | ICD-10-CM | POA: Insufficient documentation

## 2014-07-15 DIAGNOSIS — I639 Cerebral infarction, unspecified: Secondary | ICD-10-CM | POA: Insufficient documentation

## 2014-07-15 DIAGNOSIS — R600 Localized edema: Secondary | ICD-10-CM | POA: Insufficient documentation

## 2014-07-15 DIAGNOSIS — B029 Zoster without complications: Secondary | ICD-10-CM | POA: Insufficient documentation

## 2014-07-15 DIAGNOSIS — R6510 Systemic inflammatory response syndrome (SIRS) of non-infectious origin without acute organ dysfunction: Secondary | ICD-10-CM

## 2014-07-15 DIAGNOSIS — R42 Dizziness and giddiness: Secondary | ICD-10-CM | POA: Insufficient documentation

## 2014-07-15 MED ORDER — ESOMEPRAZOLE MAGNESIUM 40 MG PO CPDR: 40.0000 mg | DELAYED_RELEASE_CAPSULE | Freq: Every day | ORAL | Status: DC

## 2014-07-15 MED ORDER — FERROUS SULFATE 325 (65 FE) MG PO TABS: 325.0000 mg | ORAL_TABLET | Freq: Every day | ORAL | Status: AC

## 2014-07-15 MED ORDER — ESCITALOPRAM OXALATE 5 MG PO TABS: 5.0000 mg | ORAL_TABLET | Freq: Every day | ORAL | Status: AC

## 2014-07-15 MED ORDER — ESCITALOPRAM OXALATE 5 MG PO TABS: 5.0000 mg | ORAL_TABLET | Freq: Every day | ORAL | Status: DC

## 2014-07-15 MED ORDER — ESOMEPRAZOLE MAGNESIUM 40 MG PO CPDR: 40.0000 mg | DELAYED_RELEASE_CAPSULE | Freq: Every day | ORAL | Status: AC

## 2014-07-15 MED ORDER — FUROSEMIDE 20 MG PO TABS: 20.0000 mg | ORAL_TABLET | Freq: Two times a day (BID) | ORAL | Status: AC

## 2014-07-15 MED ORDER — ALPRAZOLAM 0.25 MG PO TABS: 0.2500 mg | ORAL_TABLET | Freq: Three times a day (TID) | ORAL | Status: AC | PRN

## 2014-07-15 NOTE — Progress Notes (Signed)
   Subjective:    Patient ID: Katie Travis, female    DOB: 1940-08-22, 74 y.o.   MRN: [id number]  Hypoglycemia Associated symptoms include fatigue and weakness. Pertinent negatives include no chills, coughing, diaphoresis or fever.   Patient was admitted for 3 days last week and discharged on June 16th after being brought into ER with mental status changes. She is brought to office today by EMS due to fatigue and weakness. Her son states that she has been increasingly fatigued and lethargic over the last 3 months and stopped taking all of her medications, and had been eating very poorly prior to admission. She was found to have a UTI when hospitalized. She was also found to be anemic which was attributed to anemia of chronic disease and dilution from IV fluids. She also had extensive neurological and psychiatric work up and felt to be very depressed. She was advised to follow up with psychiatry after discharge. She had been on escitalopram when she was last seen here several months ago, but her sons state she has not taking it in months. She had also been on lactulose in the past for chronic liver disease.   Her sons report that she has been eating and drinking well since discharge, but has still not been able to get herself out of bed due to general weakness.   Review of Systems  Constitutional: Positive for fatigue. Negative for fever, chills and diaphoresis.  Respiratory: Negative for cough and shortness of breath.   Gastrointestinal: Negative.   Neurological: Positive for speech difficulty and weakness.       Objective:   Physical Exam There were no vitals taken for this visit.  Patient is awake on hospital stretcher, oriented to person only Significant psychomotor retardation.  CV: Regular rate and rhythm Chest CTA MS: Normal UE tone. Muscle strength +4/5.       Assessment & Plan:   1. Hypoglycemia Resolved, will keep off of hypoglycemic agents for now.  2. SIRS (systemic  inflammatory response syndrome) Likely secondary to UTI which has resolved.   3. Dementia, without behavioral disturbance Likely multifactorial. Check labs.  - Vitamin B12 - Ammonia - Hepatic function panel  4. Anemia, unspecified anemia type  - CBC - Ferritin - Iron and TIBC - Retic - Folate - esomeprazole (NEXIUM) 40 MG capsule; Take 1 capsule (40 mg total) by mouth daily.  Dispense: 30 capsule; Refill: 1  5. Delirium   6. Panic disorder without agoraphobia  - ALPRAZolam (XANAX) 0.25 MG tablet; Take 1 tablet (0.25 mg total) by mouth 3 (three) times daily as needed for anxiety.  Dispense: 30 tablet; Refill: 3  7. Acute anxiety  9. Depression Refer psychiatry as per hospital discharge plan. Had previously been on escitalopram for extended period, but she stopped taking on her on a few months ago. Will start back at starting dose for now.   - escitalopram (LEXAPRO) 5 MG tablet; Take 1 tablet (5 mg total) by mouth daily.  Dispense: 30 tablet; Refill: 1  10. Renal failure Likely pre-renal. Is schedule for nephrology follow up this week.   Addressed extensive list of chronic and acute medical problems today requiring extensive time in counseling and coordination care.  Over half of this 50 minute visit were spent in counseling and coordinating care of multiple medical problems.

## 2014-07-15 NOTE — Telephone Encounter (Signed)
Please refer Hospice for advanced dementia and failure to thrive.

## 2014-07-16 ENCOUNTER — Telehealth: Payer: Self-pay

## 2014-07-16 LAB — FERRITIN: Ferritin: 66 ng/mL (ref 15–150)

## 2014-07-16 LAB — CBC
Hematocrit: 24.9 % — ABNORMAL LOW (ref 34.0–46.6)
Hemoglobin: 8.1 g/dL — ABNORMAL LOW (ref 11.1–15.9)
MCH: 25.7 pg — AB (ref 26.6–33.0)
MCHC: 32.5 g/dL (ref 31.5–35.7)
MCV: 79 fL (ref 79–97)
Platelets: 107 10*3/uL — ABNORMAL LOW (ref 150–379)
RBC: 3.15 x10E6/uL — AB (ref 3.77–5.28)
RDW: 23 % — ABNORMAL HIGH (ref 12.3–15.4)
WBC: 4.7 10*3/uL (ref 3.4–10.8)

## 2014-07-16 LAB — IRON AND TIBC
IRON SATURATION: 11 % — AB (ref 15–55)
IRON: 27 ug/dL (ref 27–139)
Total Iron Binding Capacity: 256 ug/dL (ref 250–450)
UIBC: 229 ug/dL (ref 118–369)

## 2014-07-16 LAB — VITAMIN B12: VITAMIN B 12: 1078 pg/mL — AB (ref 211–946)

## 2014-07-16 LAB — HEPATIC FUNCTION PANEL
ALT: 16 IU/L (ref 0–32)
AST: 35 IU/L (ref 0–40)
Albumin: 2.2 g/dL — ABNORMAL LOW (ref 3.5–4.8)
Alkaline Phosphatase: 95 IU/L (ref 39–117)
BILIRUBIN TOTAL: 3.2 mg/dL — AB (ref 0.0–1.2)
Bilirubin, Direct: 1.55 mg/dL — ABNORMAL HIGH (ref 0.00–0.40)
TOTAL PROTEIN: 5.9 g/dL — AB (ref 6.0–8.5)

## 2014-07-16 LAB — AMMONIA: Ammonia: 85 ug/dL (ref 19–87)

## 2014-07-16 LAB — RETICULOCYTES: Retic Ct Pct: 2.2 % (ref 0.6–2.6)

## 2014-07-16 LAB — FOLATE: FOLATE: 4.7 ng/mL (ref >3.0)

## 2014-07-16 NOTE — Telephone Encounter (Signed)
Harlin (Pt's son) advised as directed below.   Thanks,   -Vernona Rieger

## 2014-07-16 NOTE — Telephone Encounter (Signed)
-----   Message from Malva Limes, MD sent at 07/16/2014  1:15 PM EDT ----- Please advise that labs are all normal except she is still anemia, although improved since discharge from hospital. Need to take iron sulfate 325mg  twice a day.

## 2014-07-17 ENCOUNTER — Encounter: Payer: Self-pay | Admitting: Family Medicine

## 2014-07-17 NOTE — Telephone Encounter (Signed)
Amil Amen from Hospice is needing to know what type of dementia pt has.She also would like to know if pt is having behavioral issues.Call back # is [phone]

## 2014-07-17 NOTE — Telephone Encounter (Signed)
Frontotemporal dementia. Yes she gets very anxious and agitated at times.

## 2014-07-17 NOTE — Telephone Encounter (Signed)
Senile dementia

## 2014-07-17 NOTE — Telephone Encounter (Signed)
Amil Amen from Hospice needs to know which type of dementia pt has.Insurance company will not approve unless we have diagnosis of alzheimer's.vascular or frontotemporal.She also needs to know if pt is having any behavioral issues.Her call back # is [phone]

## 2014-07-18 NOTE — Telephone Encounter (Signed)
Julia advised.

## 2014-07-21 ENCOUNTER — Telehealth: Payer: Self-pay | Admitting: Family Medicine

## 2014-07-24 ENCOUNTER — Telehealth: Payer: Self-pay

## 2014-07-24 DIAGNOSIS — R2689 Other abnormalities of gait and mobility: Secondary | ICD-10-CM

## 2014-07-24 DIAGNOSIS — R531 Weakness: Secondary | ICD-10-CM

## 2014-07-24 NOTE — Telephone Encounter (Signed)
Ok to order for diagnosis noted. Thanks.

## 2014-07-24 NOTE — Telephone Encounter (Signed)
OK to order PT through [facility].

## 2014-07-24 NOTE — Telephone Encounter (Signed)
Patient son advised. I called [facility] at [phone] and the receptionist advised me that all referrals need to be made by calling the referral intake line at [phone]. They will then collect information from us regarding the patient and will fax a referral form to our office.  What dx code should I use for this referral? Weakness? Decreased mobility?

## 2014-07-24 NOTE — Telephone Encounter (Signed)
Called patient son Katie Travis to get more information. He would like to have an order for Physical Therapy to come out to the home to help his mom with getting in and out of bed. She mostly lays in bed all day and he wants to help her with getting more mobile. Call John at [phone].        Katie Travis at 07/24/2014 10:11 AM      Status: Signed       Expand All Collapse All   Son called wanting to know if [facility] can get orders to come out to her home for PT. His call back is [phone]. Thanks, Katie Travis

## 2014-07-25 NOTE — Telephone Encounter (Signed)
Order placed for PT and sent to Maralyn SagoSarah

## 2014-08-18 ENCOUNTER — Other Ambulatory Visit: Payer: Self-pay | Admitting: Family Medicine

## 2014-08-30 ENCOUNTER — Other Ambulatory Visit: Payer: Self-pay | Admitting: Family Medicine

## 2014-09-01 ENCOUNTER — Ambulatory Visit: Payer: Medicare Other | Admitting: Psychiatry

## 2014-09-26 ENCOUNTER — Other Ambulatory Visit: Payer: Self-pay | Admitting: Family Medicine

## 2014-09-30 ENCOUNTER — Telehealth: Payer: Self-pay | Admitting: Family Medicine

## 2014-09-30 DIAGNOSIS — I639 Cerebral infarction, unspecified: Secondary | ICD-10-CM

## 2014-09-30 DIAGNOSIS — R410 Disorientation, unspecified: Secondary | ICD-10-CM

## 2014-09-30 NOTE — Telephone Encounter (Signed)
Pt's family calling and thinks it is time to call hospice in.  Not eating,  Completely bed ridden, seems to be giving up.  Call back is  [phone]  Thanks, Barth Kirks

## 2014-09-30 NOTE — Telephone Encounter (Signed)
Patient is not eating, and is not taking medications. Patient has not eaten anything substantial in 2 days. Requesting hospice nurse and catherization. Please advise?

## 2014-10-01 NOTE — Telephone Encounter (Signed)
Please set up home hospice referral. Thanks.

## 2014-10-02 NOTE — Telephone Encounter (Signed)
Please refer-aa 

## 2014-10-03 NOTE — Telephone Encounter (Signed)
Katie Travis pt's son-n-law called requesting a call back about the hospice referral. CB# [phone].CC

## 2014-10-03 NOTE — Telephone Encounter (Signed)
Returned call. Patient's son-n-law Jonny Ruiz just wanted to let Dr. Sherrie Mustache know that hospice will be coming out to see pt tomorrow 10/04/2014.

## 2014-10-06 ENCOUNTER — Telehealth: Payer: Self-pay | Admitting: *Deleted

## 2014-10-06 NOTE — Telephone Encounter (Signed)
Received call-a-nurse fax from [facility] requesting an rx for liquid morphine. Burna Mortimer stated that patient is in a lot of pain today.

## 2014-10-06 NOTE — Telephone Encounter (Signed)
Katie Travis with Hospice wanted to let Dr. Sherrie Mustache know that pt is going to use the Hospice physician as her attending physician for now. Thanks TNP

## 2014-10-10 ENCOUNTER — Telehealth: Payer: Self-pay

## 2014-10-10 MED ORDER — MORPHINE SULFATE (CONCENTRATE) 20 MG/ML PO SOLN: ORAL | Status: AC

## 2014-10-10 NOTE — Telephone Encounter (Signed)
Pharmacist Tristin from [facility] called stating that Dr. Sullivan Lone phoned in an emergency prescription for:  Morphine /57ml liquid take 1/2-67ml by mouth every 2 hours as needed for Pain (Hospice patient) on 10/05/2014. Tristin states that in order for this to be legal she needs a Hard copy of this rx faxed to her. The fax number is [phone]. [facility] [address] [place]. I advised Tristin that Dr. Sullivan Lone is out of the office today and she states it is ok to wait until Monday to fax the rx.  Questions call Tristin at [phone].

## 2014-10-10 NOTE — Telephone Encounter (Signed)
I have RX printed and on desk 213. Waiting for Dr. Sullivan Lone to sign Rx.

## 2014-10-10 NOTE — Telephone Encounter (Signed)
Please print

## 2014-10-25 DEATH — deceased

## 2015-08-31 NOTE — Telephone Encounter (Signed)
Error

## 2017-05-30 IMAGING — CT CT HEAD W/O CM
4 series · 18 of 30 positions shown, 19 images · non-contrast
Comparison: 10/29/2013

CLINICAL DATA: Hypoglycemia, unresponsive for most of the day

EXAM:
CT HEAD WITHOUT CONTRAST
TECHNIQUE: Contiguous axial images were obtained from the base of the skull
through the vertex without intravenous contrast.

[Series 2: head bone · axial · 0.43mm/px · z∈[+291,+379]mm · 6 of 74 slices shown]
[im 8/74  bone]
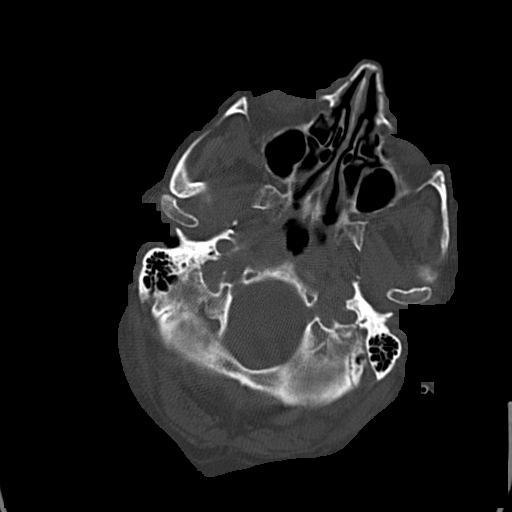
[im 15/74  bone]
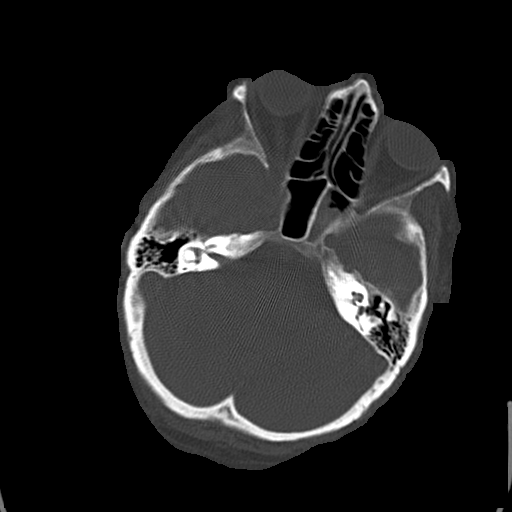
[im 22/74  bone]
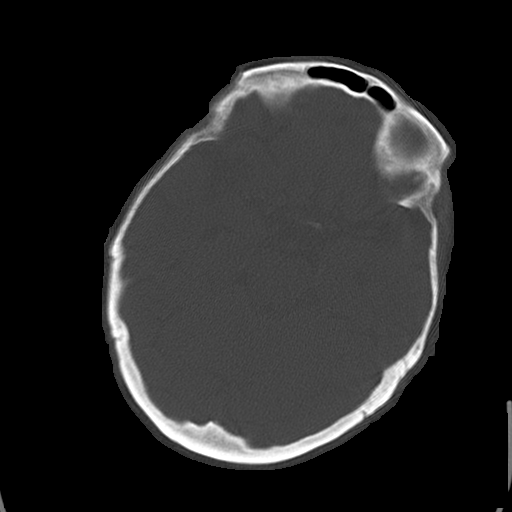
[im 30/74  bone]
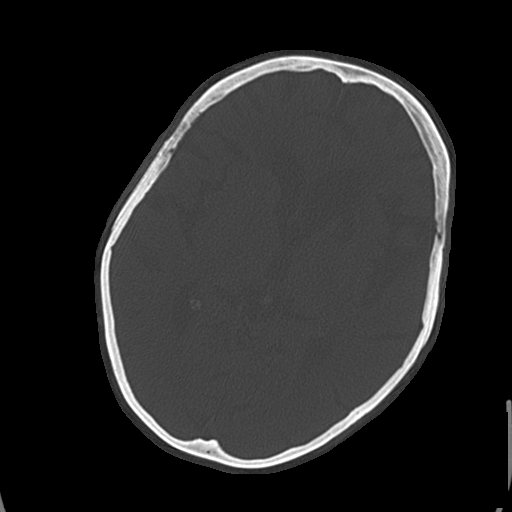
[im 44/74  bone]
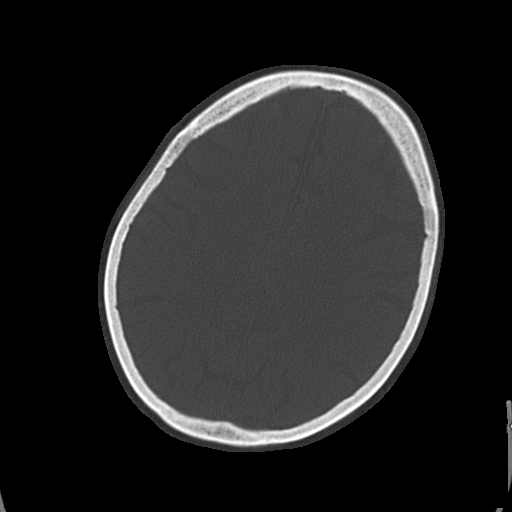
[im 52/74  bone]
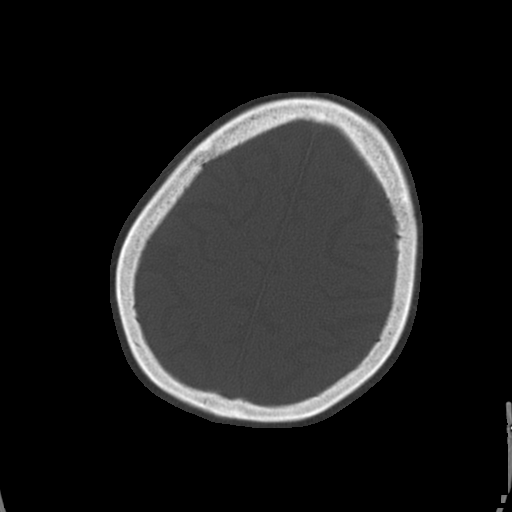

[Series 3: head wo · axial · 0.43mm/px · z∈[+326,+366]mm · 2 of 26 slices shown]
[im 9/26  brain]
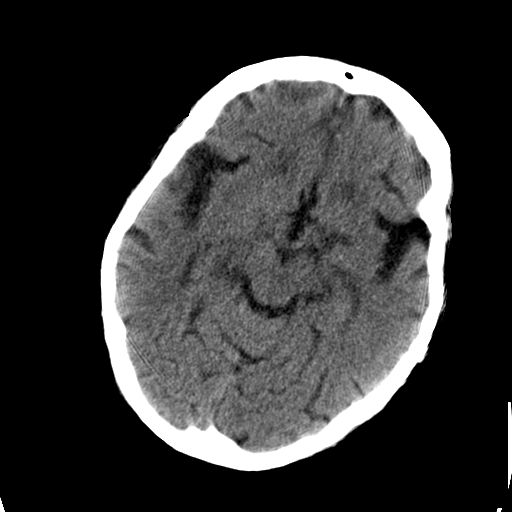
[im 17/26  brain]
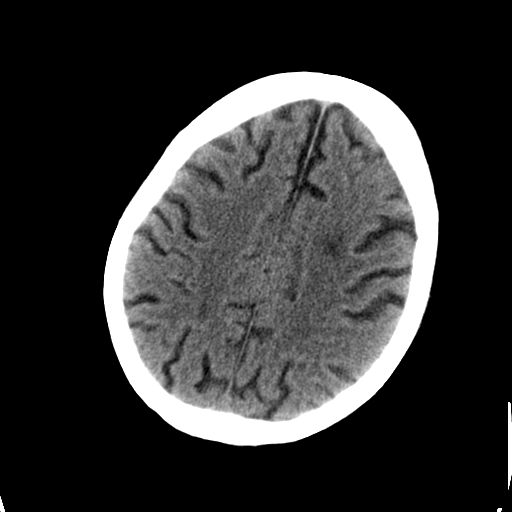

[Series 4: head wo recon · axial · 0.43mm/px · z∈[+335,+379]mm · 2 of 27 slices shown, 3 images]
[im 9/27  brain]
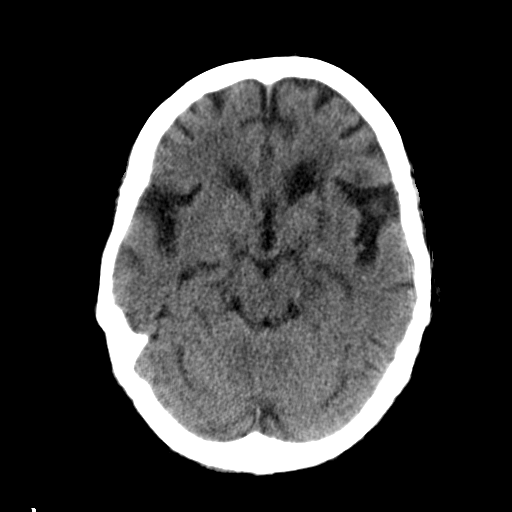
[im 9/27  bone]
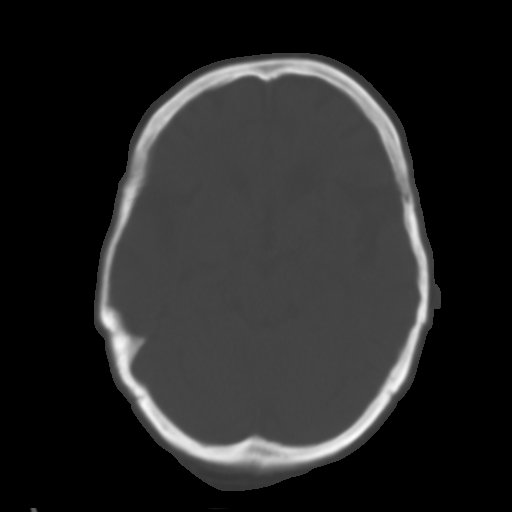
[im 18/27  brain]
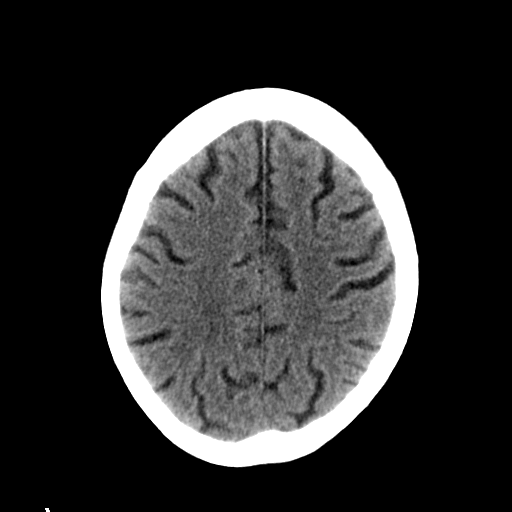

[Series 5: head bone recon · axial · 0.43mm/px · z∈[+306,+423]mm · 8 of 75 slices shown]
[im 8/75  bone]
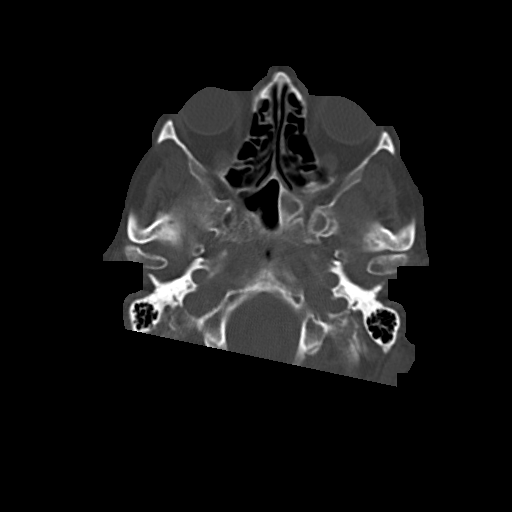
[im 15/75  bone]
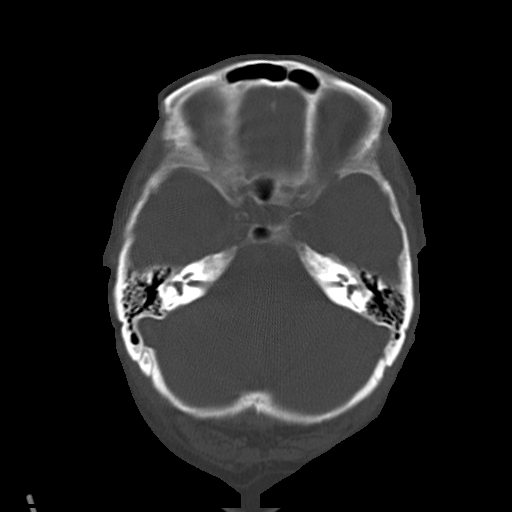
[im 23/75  bone]
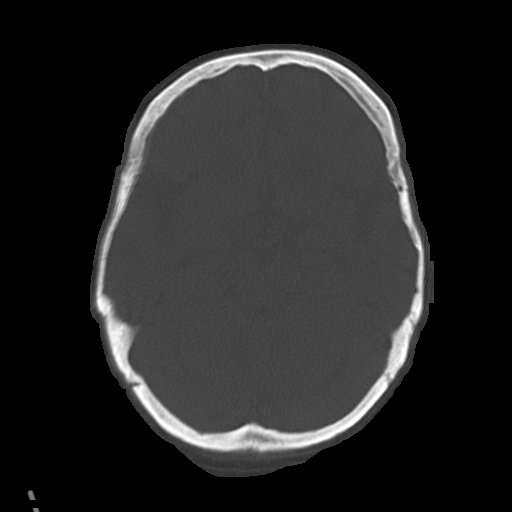
[im 30/75  bone]
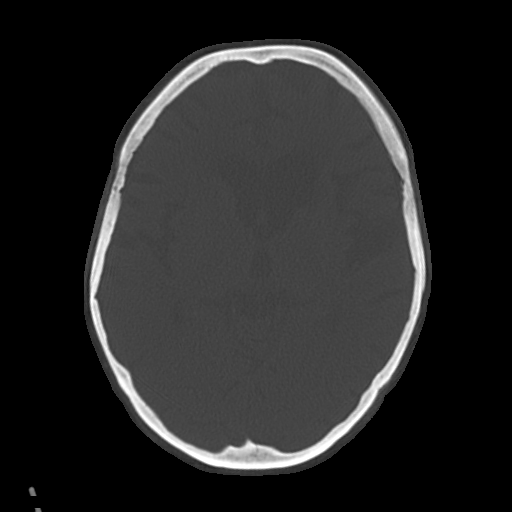
[im 45/75  bone]
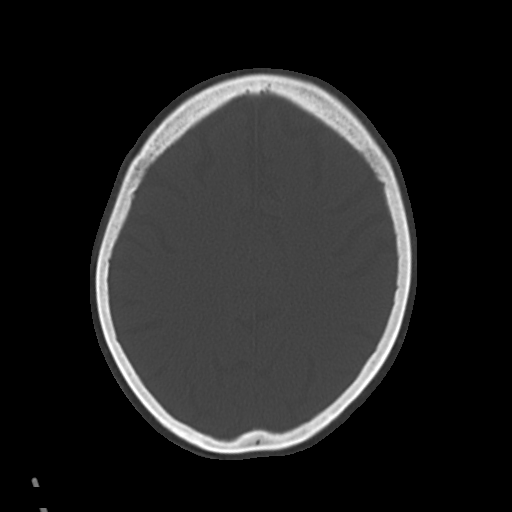
[im 52/75  bone]
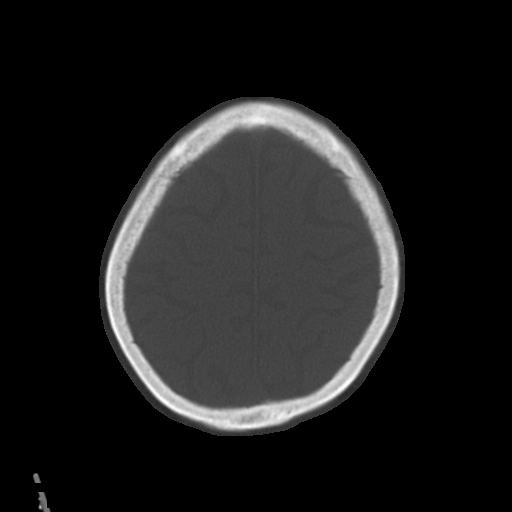
[im 60/75  bone]
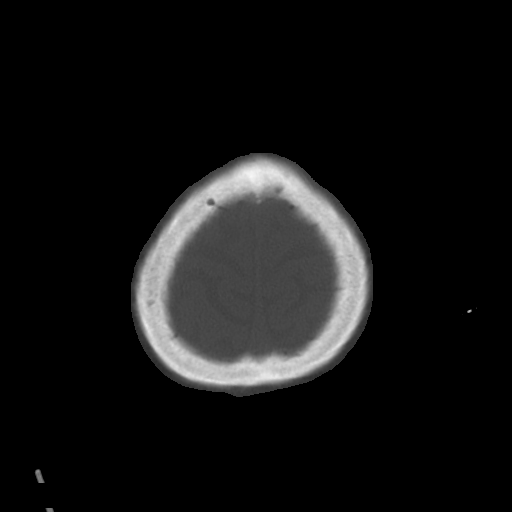
[im 67/75  bone]
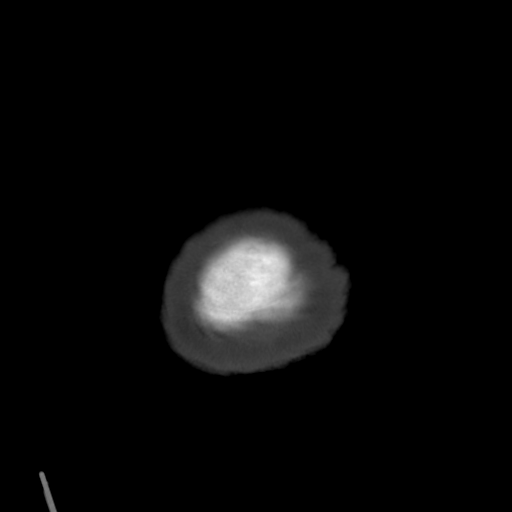

[18 of 30 positions shown; findings below may reference images not displayed]

FINDINGS: There is no evidence of mass effect, midline shift, or extra-axial
fluid collections. There is no evidence of a space-occupying lesion
or intracranial hemorrhage. There is no evidence of a cortical-based
area of acute infarction. There is an old left basal ganglia lacunar
infarct. There is generalized cerebral atrophy. There is
periventricular white matter low attenuation likely secondary to
microangiopathy.

The ventricles and sulci are appropriate for the patient's age. The
basal cisterns are patent.

Visualized portions of the orbits are unremarkable. The visualized
portions of the paranasal sinuses and mastoid air cells are
unremarkable. Cerebrovascular atherosclerotic calcifications are
noted.

The osseous structures are unremarkable.
IMPRESSION: 1. No acute intracranial pathology.
2. Chronic microvascular disease and cerebral atrophy.
